# Patient Record
Sex: Male | Born: 1955
Health system: Southern US, Community
[De-identification: ages and names within clinical notes are randomized; demographics above are authoritative.]

## PROBLEM LIST (undated history)

## (undated) DIAGNOSIS — N200 Calculus of kidney: Secondary | ICD-10-CM

## (undated) DIAGNOSIS — I219 Acute myocardial infarction, unspecified: Secondary | ICD-10-CM

## (undated) DIAGNOSIS — C801 Malignant (primary) neoplasm, unspecified: Secondary | ICD-10-CM

## (undated) DIAGNOSIS — J189 Pneumonia, unspecified organism: Secondary | ICD-10-CM

## (undated) DIAGNOSIS — I10 Essential (primary) hypertension: Secondary | ICD-10-CM

## (undated) DIAGNOSIS — Z720 Tobacco use: Secondary | ICD-10-CM

## (undated) DIAGNOSIS — E785 Hyperlipidemia, unspecified: Secondary | ICD-10-CM

## (undated) DIAGNOSIS — Z87442 Personal history of urinary calculi: Secondary | ICD-10-CM

## (undated) DIAGNOSIS — L409 Psoriasis, unspecified: Secondary | ICD-10-CM

## (undated) HISTORY — DX: Essential (primary) hypertension: I10

## (undated) HISTORY — DX: Psoriasis, unspecified: L40.9

## (undated) HISTORY — DX: Pneumonia, unspecified organism: J18.9

## (undated) HISTORY — PX: COLONOSCOPY: SHX5424

## (undated) HISTORY — DX: Hyperlipidemia, unspecified: E78.5

## (undated) HISTORY — DX: Calculus of kidney: N20.0

---
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
## 2008-09-02 ENCOUNTER — Emergency Department (HOSPITAL_COMMUNITY): Admission: EM | Admit: 2008-09-02 | Discharge: 2008-09-02 | Payer: Self-pay | Admitting: Emergency Medicine

## 2008-09-11 ENCOUNTER — Ambulatory Visit (HOSPITAL_BASED_OUTPATIENT_CLINIC_OR_DEPARTMENT_OTHER): Admission: RE | Admit: 2008-09-11 | Discharge: 2008-09-11 | Payer: Self-pay | Admitting: Urology

## 2009-03-09 ENCOUNTER — Emergency Department (HOSPITAL_COMMUNITY): Admission: EM | Admit: 2009-03-09 | Discharge: 2009-03-09 | Payer: Self-pay | Admitting: Emergency Medicine

## 2010-04-02 LAB — COMPREHENSIVE METABOLIC PANEL
ALT: 26 U/L (ref 0–53)
AST: 19 U/L (ref 0–37)
Albumin: 4.3 g/dL (ref 3.5–5.2)
Alkaline Phosphatase: 37 U/L — ABNORMAL LOW (ref 39–117)
BUN: 18 mg/dL (ref 6–23)
CO2: 29 mEq/L (ref 19–32)
Calcium: 9.3 mg/dL (ref 8.4–10.5)
Chloride: 107 mEq/L (ref 96–112)
Creatinine, Ser: 0.83 mg/dL (ref 0.4–1.5)
GFR calc Af Amer: >60 mL/min (ref >60)
GFR calc non Af Amer: >60 mL/min (ref >60)
Glucose, Bld: 109 mg/dL — ABNORMAL HIGH (ref 70–99)
Potassium: 4.1 mEq/L (ref 3.5–5.1)
Sodium: 142 mEq/L (ref 135–145)
Total Bilirubin: 0.5 mg/dL (ref 0.3–1.2)
Total Protein: 7.1 g/dL (ref 6.0–8.3)

## 2010-04-02 LAB — CBC
HCT: 43.6 % (ref 39.0–52.0)
Hemoglobin: 14.4 g/dL (ref 13.0–17.0)
MCHC: 33.1 g/dL (ref 30.0–36.0)
MCV: 91 fL (ref 78.0–100.0)
Platelets: 321 10*3/uL (ref 150–400)
RBC: 4.79 MIL/uL (ref 4.22–5.81)
RDW: 13.3 % (ref 11.5–15.5)
WBC: 7.9 10*3/uL (ref 4.0–10.5)

## 2010-04-02 LAB — DIFFERENTIAL
Basophils Absolute: 0 10*3/uL (ref 0.0–0.1)
Basophils Relative: 0 % (ref 0–1)
Eosinophils Absolute: 0.4 10*3/uL (ref 0.0–0.7)
Eosinophils Relative: 5 % (ref 0–5)
Lymphocytes Relative: 26 % (ref 12–46)
Lymphs Abs: 2.1 10*3/uL (ref 0.7–4.0)
Monocytes Absolute: 0.6 10*3/uL (ref 0.1–1.0)
Monocytes Relative: 7 % (ref 3–12)
Neutro Abs: 4.8 10*3/uL (ref 1.7–7.7)
Neutrophils Relative %: 61 % (ref 43–77)

## 2010-04-02 LAB — URINALYSIS, ROUTINE W REFLEX MICROSCOPIC
Bilirubin Urine: NEGATIVE
Glucose, UA: NEGATIVE mg/dL
Hgb urine dipstick: NEGATIVE
Ketones, ur: NEGATIVE mg/dL
Nitrite: NEGATIVE
Protein, ur: NEGATIVE mg/dL
Specific Gravity, Urine: 1.011 (ref 1.005–1.030)
Urobilinogen, UA: 0.2 mg/dL (ref 0.0–1.0)
pH: 6.5 (ref 5.0–8.0)

## 2010-04-02 LAB — D-DIMER, QUANTITATIVE (NOT AT ARMC): D-Dimer, Quant: 0.34 ug/mL-FEU (ref 0.00–0.48)

## 2010-04-02 LAB — LIPASE, BLOOD: Lipase: 35 U/L (ref 11–59)

## 2010-04-19 LAB — URINE MICROSCOPIC-ADD ON

## 2010-04-19 LAB — URINALYSIS, ROUTINE W REFLEX MICROSCOPIC
Bilirubin Urine: NEGATIVE
Glucose, UA: NEGATIVE mg/dL
Ketones, ur: NEGATIVE mg/dL
Leukocytes, UA: NEGATIVE
Nitrite: NEGATIVE
Protein, ur: NEGATIVE mg/dL
Specific Gravity, Urine: 1.021 (ref 1.005–1.030)
Urobilinogen, UA: 0.2 mg/dL (ref 0.0–1.0)
pH: 6 (ref 5.0–8.0)

## 2010-04-19 LAB — CBC
HCT: 43.9 % (ref 39.0–52.0)
Hemoglobin: 14.8 g/dL (ref 13.0–17.0)
MCHC: 33.6 g/dL (ref 30.0–36.0)
MCV: 91.9 fL (ref 78.0–100.0)
Platelets: 250 10*3/uL (ref 150–400)
RBC: 4.78 MIL/uL (ref 4.22–5.81)
RDW: 14.4 % (ref 11.5–15.5)
WBC: 7.7 10*3/uL (ref 4.0–10.5)

## 2010-04-19 LAB — BASIC METABOLIC PANEL
BUN: 20 mg/dL (ref 6–23)
CO2: 28 mEq/L (ref 19–32)
Calcium: 9.5 mg/dL (ref 8.4–10.5)
Chloride: 109 mEq/L (ref 96–112)
Creatinine, Ser: 1.02 mg/dL (ref 0.4–1.5)
GFR calc Af Amer: >60 mL/min (ref >60)
GFR calc non Af Amer: >60 mL/min (ref >60)
Glucose, Bld: 110 mg/dL — ABNORMAL HIGH (ref 70–99)
Potassium: 4.5 mEq/L (ref 3.5–5.1)
Sodium: 143 mEq/L (ref 135–145)

## 2010-04-19 LAB — POCT I-STAT, CHEM 8
BUN: 18 mg/dL (ref 6–23)
Calcium, Ion: 1.15 mmol/L (ref 1.12–1.32)
Chloride: 104 mEq/L (ref 96–112)
Creatinine, Ser: 1.2 mg/dL (ref 0.4–1.5)
Glucose, Bld: 110 mg/dL — ABNORMAL HIGH (ref 70–99)
HCT: 44 % (ref 39.0–52.0)
Hemoglobin: 15 g/dL (ref 13.0–17.0)
Potassium: 4 mEq/L (ref 3.5–5.1)
Sodium: 140 mEq/L (ref 135–145)
TCO2: 26 mmol/L (ref 0–100)

## 2010-04-19 LAB — GLUCOSE, CAPILLARY: Glucose-Capillary: 117 mg/dL — ABNORMAL HIGH (ref 70–99)

## 2010-05-27 NOTE — Op Note (Signed)
[name], [name]                 ACCOUNT NO.:  [id number]   MEDICAL RECORD NO.:  [id number]          PATIENT TYPE:  AMB   LOCATION:  [facility]                         FACILITY:  [facility]   PHYSICIAN:  Courtney Paris, M.D.DATE OF BIRTH:  1955/05/07   DATE OF PROCEDURE:  09/11/2008  DATE OF DISCHARGE:                               OPERATIVE REPORT   PREOPERATIVE DIAGNOSIS:  Left distal ureteral stones.   POSTOPERATIVE DIAGNOSIS:  Left distal ureteral stones.   OPERATION:  Cystoscopy, left retrograde pyelogram, left ureteroscopy  with holmium laser and insertion of left ureteral stent.   ANESTHESIA:  General.   SURGEON:  Courtney Paris, M.D.   BRIEF HISTORY:  This 55 year old white male was admitted with non  progression of a left distal stone for ureteroscopy.  He presented to  the emergency room on September 03, 2008 with a 7 x 5 mm stone at the left  distal ureter.  He has had no other stones in the kidneys at this time.  He passed his first stone at age 55, and I did ureteroscopy in 1987 for  his last stone which it took over 3 weeks for him to try to pass before  he had ureteroscopy.   DESCRIPTION OF PROCEDURE:  The patient was placed on the operating table  in the dorsal lithotomy position.  After satisfactory induction of  general anesthesia he was given IV Cipro.  Time-out was then performed  and the patient and procedure were then reconfirmed.  He was prepped and  draped with Betadine.  The 21 panendoscope was inserted.  No anterior  urethral strictures seen.  Posterior urethra was nonobstructing.  The  bladder was entered.  The bladder was normal except for enlargement of  the left distal ureter probably caused by the impacted stone present.  I  used a 5 open-ended ureteral catheter with a Glidewire to be able to  negotiate into the orifice where the stone was impacted.  Under  fluoroscopy I was able to negotiate this past the stone.  Removed the  Glidewire then  performed a occlusive retrograde.  This demonstrated the  stone in the distal ureter and some moderate hydronephrosis noted of the  left kidney.  No other filling defects were seen.  Under fluoroscopy I  was able to pass a guidewire then up to the level of the kidney and  remove the open-ended catheter.  I removed the scope and then using a  cannula for a ureteral access sheath I was able to under fluoroscopy  dilate the distal left ureter leaving the guidewire in place.  I passed  the 6 short ureteroscope into the left ureteral orifice up to the stone,  but it looked to be too big to try to pull out without breaking it up.  The holmium laser was then calibrated and using the 320 micron fiber at  0.5 watts I was able to break the stone into two pieces.  I was then  able to get a basket on the stones and pull them out intact.  Another  passage of the  scope revealed no further stones, but there was a lot of  edema and swelling of the distal ureter where the stone was impacted.  For that reason I chose to leave a ureteral stent.  Over the guidewire I  passed the cystoscope and then a 6 x 26 cm length double-J ureteral  stent over the guidewire into the kidney and as I removed the guidewire  there was a nice coil in the renal pelvis and one in the bladder.  I  adjusted  slightly with grasping forceps.  The bladder was drained, scope removed.  He was given a B and O suppository and injection of Toradol.  He was  taken to the recovery room in good condition and will be later  discharged as an outpatient with detailed written instructions and will  remove the stent in a week.      Courtney Paris, M.D.  Electronically Signed     HMK/MEDQ  D:  09/11/2008  T:  09/11/2008  Job:  [id number]

## 2011-06-10 ENCOUNTER — Ambulatory Visit
Admission: RE | Admit: 2011-06-10 | Discharge: 2011-06-10 | Disposition: A | Discharge status: Home / Self Care | Payer: BC Managed Care – PPO | Source: Ambulatory Visit | Attending: Family Medicine | Admitting: Family Medicine

## 2011-06-10 ENCOUNTER — Other Ambulatory Visit: Payer: Self-pay | Admitting: Family Medicine

## 2011-06-10 DIAGNOSIS — R079 Chest pain, unspecified: Secondary | ICD-10-CM

## 2011-06-25 ENCOUNTER — Encounter (INDEPENDENT_AMBULATORY_CARE_PROVIDER_SITE_OTHER): Payer: Self-pay | Admitting: Surgery

## 2011-08-25 ENCOUNTER — Ambulatory Visit (INDEPENDENT_AMBULATORY_CARE_PROVIDER_SITE_OTHER): Payer: BC Managed Care – PPO | Admitting: Surgery

## 2015-04-30 ENCOUNTER — Other Ambulatory Visit: Payer: Self-pay | Admitting: Family Medicine

## 2015-04-30 DIAGNOSIS — M5416 Radiculopathy, lumbar region: Secondary | ICD-10-CM

## 2015-05-01 ENCOUNTER — Ambulatory Visit
Admission: RE | Admit: 2015-05-01 | Discharge: 2015-05-01 | Disposition: A | Discharge status: Home / Self Care | Payer: 59 | Source: Ambulatory Visit | Attending: Family Medicine | Admitting: Family Medicine

## 2015-05-01 ENCOUNTER — Other Ambulatory Visit: Payer: Self-pay | Admitting: Family Medicine

## 2015-05-01 DIAGNOSIS — T7589XA Other specified effects of external causes, initial encounter: Secondary | ICD-10-CM

## 2015-05-01 DIAGNOSIS — M5416 Radiculopathy, lumbar region: Secondary | ICD-10-CM

## 2015-05-02 ENCOUNTER — Other Ambulatory Visit: Payer: Self-pay

## 2015-05-04 ENCOUNTER — Other Ambulatory Visit: Payer: Self-pay

## 2016-09-15 DIAGNOSIS — E782 Mixed hyperlipidemia: Secondary | ICD-10-CM | POA: Diagnosis not present

## 2016-09-15 DIAGNOSIS — I10 Essential (primary) hypertension: Secondary | ICD-10-CM | POA: Diagnosis not present

## 2016-09-15 DIAGNOSIS — Z23 Encounter for immunization: Secondary | ICD-10-CM | POA: Diagnosis not present

## 2016-09-15 DIAGNOSIS — E559 Vitamin D deficiency, unspecified: Secondary | ICD-10-CM | POA: Diagnosis not present

## 2016-09-29 DIAGNOSIS — D1801 Hemangioma of skin and subcutaneous tissue: Secondary | ICD-10-CM | POA: Diagnosis not present

## 2016-09-29 DIAGNOSIS — L821 Other seborrheic keratosis: Secondary | ICD-10-CM | POA: Diagnosis not present

## 2016-09-29 DIAGNOSIS — L814 Other melanin hyperpigmentation: Secondary | ICD-10-CM | POA: Diagnosis not present

## 2016-11-24 DIAGNOSIS — H353131 Nonexudative age-related macular degeneration, bilateral, early dry stage: Secondary | ICD-10-CM | POA: Diagnosis not present

## 2016-11-24 DIAGNOSIS — H2513 Age-related nuclear cataract, bilateral: Secondary | ICD-10-CM | POA: Diagnosis not present

## 2016-11-24 DIAGNOSIS — H25013 Cortical age-related cataract, bilateral: Secondary | ICD-10-CM | POA: Diagnosis not present

## 2017-02-24 DIAGNOSIS — J029 Acute pharyngitis, unspecified: Secondary | ICD-10-CM | POA: Diagnosis not present

## 2017-02-24 DIAGNOSIS — J0110 Acute frontal sinusitis, unspecified: Secondary | ICD-10-CM | POA: Diagnosis not present

## 2017-04-13 DIAGNOSIS — I10 Essential (primary) hypertension: Secondary | ICD-10-CM | POA: Diagnosis not present

## 2017-04-13 DIAGNOSIS — E782 Mixed hyperlipidemia: Secondary | ICD-10-CM | POA: Diagnosis not present

## 2017-09-27 DIAGNOSIS — N529 Male erectile dysfunction, unspecified: Secondary | ICD-10-CM | POA: Diagnosis not present

## 2017-09-27 DIAGNOSIS — E559 Vitamin D deficiency, unspecified: Secondary | ICD-10-CM | POA: Diagnosis not present

## 2017-09-27 DIAGNOSIS — J0101 Acute recurrent maxillary sinusitis: Secondary | ICD-10-CM | POA: Diagnosis not present

## 2017-09-27 DIAGNOSIS — E782 Mixed hyperlipidemia: Secondary | ICD-10-CM | POA: Diagnosis not present

## 2017-09-27 DIAGNOSIS — Z0000 Encounter for general adult medical examination without abnormal findings: Secondary | ICD-10-CM | POA: Diagnosis not present

## 2017-09-27 DIAGNOSIS — G4700 Insomnia, unspecified: Secondary | ICD-10-CM | POA: Diagnosis not present

## 2017-09-29 DIAGNOSIS — D1801 Hemangioma of skin and subcutaneous tissue: Secondary | ICD-10-CM | POA: Diagnosis not present

## 2017-09-29 DIAGNOSIS — L814 Other melanin hyperpigmentation: Secondary | ICD-10-CM | POA: Diagnosis not present

## 2017-09-29 DIAGNOSIS — L821 Other seborrheic keratosis: Secondary | ICD-10-CM | POA: Diagnosis not present

## 2017-10-11 DIAGNOSIS — Z23 Encounter for immunization: Secondary | ICD-10-CM | POA: Diagnosis not present

## 2017-12-03 DIAGNOSIS — H524 Presbyopia: Secondary | ICD-10-CM | POA: Diagnosis not present

## 2017-12-03 DIAGNOSIS — D3131 Benign neoplasm of right choroid: Secondary | ICD-10-CM | POA: Diagnosis not present

## 2017-12-03 DIAGNOSIS — H35033 Hypertensive retinopathy, bilateral: Secondary | ICD-10-CM | POA: Diagnosis not present

## 2017-12-03 DIAGNOSIS — H353131 Nonexudative age-related macular degeneration, bilateral, early dry stage: Secondary | ICD-10-CM | POA: Diagnosis not present

## 2018-03-28 DIAGNOSIS — E782 Mixed hyperlipidemia: Secondary | ICD-10-CM | POA: Diagnosis not present

## 2018-03-28 DIAGNOSIS — I10 Essential (primary) hypertension: Secondary | ICD-10-CM | POA: Diagnosis not present

## 2018-03-28 DIAGNOSIS — E559 Vitamin D deficiency, unspecified: Secondary | ICD-10-CM | POA: Diagnosis not present

## 2018-03-28 DIAGNOSIS — E538 Deficiency of other specified B group vitamins: Secondary | ICD-10-CM | POA: Diagnosis not present

## 2018-03-28 DIAGNOSIS — G4700 Insomnia, unspecified: Secondary | ICD-10-CM | POA: Diagnosis not present

## 2018-03-28 DIAGNOSIS — N529 Male erectile dysfunction, unspecified: Secondary | ICD-10-CM | POA: Diagnosis not present

## 2018-11-28 ENCOUNTER — Other Ambulatory Visit: Payer: Self-pay | Admitting: *Deleted

## 2018-11-28 DIAGNOSIS — Z122 Encounter for screening for malignant neoplasm of respiratory organs: Secondary | ICD-10-CM

## 2018-11-28 DIAGNOSIS — F17210 Nicotine dependence, cigarettes, uncomplicated: Secondary | ICD-10-CM

## 2018-12-14 ENCOUNTER — Ambulatory Visit
Admission: RE | Admit: 2018-12-14 | Discharge: 2018-12-14 | Disposition: A | Discharge status: Home / Self Care | Payer: 59 | Source: Ambulatory Visit | Attending: Acute Care | Admitting: Acute Care

## 2018-12-14 ENCOUNTER — Encounter: Payer: Self-pay | Admitting: Acute Care

## 2018-12-14 ENCOUNTER — Other Ambulatory Visit: Payer: Self-pay

## 2018-12-14 ENCOUNTER — Ambulatory Visit (INDEPENDENT_AMBULATORY_CARE_PROVIDER_SITE_OTHER): Payer: 59 | Admitting: Acute Care

## 2018-12-14 VITALS — BP 134/60 | HR 59 | Temp 98.2°F | Ht 71.0 in | Wt 182.0 lb

## 2018-12-14 DIAGNOSIS — Z122 Encounter for screening for malignant neoplasm of respiratory organs: Secondary | ICD-10-CM

## 2018-12-14 DIAGNOSIS — F17210 Nicotine dependence, cigarettes, uncomplicated: Secondary | ICD-10-CM | POA: Diagnosis not present

## 2018-12-14 NOTE — Patient Instructions (Signed)
Thank you for participating in the [place] Scre[facility]. It was our pleasure to meet you today. We will call you with the results of your scan within the next few days. Your scan will be assigned a Lung RADS category score by the physicians reading the scans.  This Lung RADS score determines follow up scanning.  See below for description of categories, and follow up screening recommendations. We will be in touch to schedule your follow up screening annually or based on recommendations of our providers. We will fax a copy of your scan results to your Primary Care Physician, or the physician who referred you to the program, to ensure they have the results. Please call the office if you have any questions or concerns regarding your scanning experience or results.  Our office number is [phone]. Please speak with Denise Phelps, RN. She is our Lung Cancer Screening RN. If she is unavailable when you call, please have the office staff send her a message. She will return your call at her earliest convenience. Remember, if your scan is normal, we will scan you annually as long as you continue to meet the criteria for the program. (Age 55-77, Current smoker or smoker who has quit within the last 15 years). If you are a smoker, remember, quitting is the single most powerful action that you can take to decrease your risk of lung cancer and other pulmonary, breathing related problems. We know quitting is hard, and we are here to help.  Please let us know if there is anything we can do to help you meet your goal of quitting. If you are a former smoker, congratulations. We are proud of you! Remain smoke free! Remember you can refer friends or family members through the number above.  We will screen them to make sure they meet criteria for the program. Thank you for helping us take better care of you by participating in Lung Screening.  Lung RADS Categories:  Lung RADS 1: no nodules  or definitely non-concerning nodules.  Recommendation is for a repeat annual scan in 12 months.  Lung RADS 2:  nodules that are non-concerning in appearance and behavior with a very low likelihood of becoming an active cancer. Recommendation is for a repeat annual scan in 12 months.  Lung RADS 3: nodules that are probably non-concerning , includes nodules with a low likelihood of becoming an active cancer.  Recommendation is for a 6-month repeat screening scan. Often noted after an upper respiratory illness. We will be in touch to make sure you have no questions, and to schedule your 6-month scan.  Lung RADS 4 A: nodules with concerning findings, recommendation is most often for a follow up scan in 3 months or additional testing based on our provider's assessment of the scan. We will be in touch to make sure you have no questions and to schedule the recommended 3 month follow up scan.  Lung RADS 4 B:  indicates findings that are concerning. We will be in touch with you to schedule additional diagnostic testing based on our provider's  assessment of the scan.   

## 2018-12-14 NOTE — Progress Notes (Signed)
Shared Decision Making Visit Lung Cancer Screening Program [phone])   Eligibility:  Age 63 y.o.  Pack Years Smoking History Calculation 43 pack years (# packs/per year x # years smoked)  Recent History of coughing up blood  no  Unexplained weight loss? no ( >Than 15 pounds within the last 6 months )  Prior History Lung / other cancer no (Diagnosis within the last 5 years already requiring surveillance chest CT Scans).  Smoking Status Current Smoker  Former Smokers: Years since quit: NA  Quit Date: NA  Visit Components:  Discussion included one or more decision making aids. yes  Discussion included risk/benefits of screening. yes  Discussion included potential follow up diagnostic testing for abnormal scans. yes  Discussion included meaning and risk of over diagnosis. yes  Discussion included meaning and risk of False Positives. yes  Discussion included meaning of total radiation exposure. yes  Counseling Included:  Importance of adherence to annual lung cancer LDCT screening. yes  Impact of comorbidities on ability to participate in the program. yes  Ability and willingness to under diagnostic treatment. yes  Smoking Cessation Counseling:  Current Smokers:   Discussed importance of smoking cessation. yes  Information about tobacco cessation classes and interventions provided to patient. yes  Patient provided with "ticket" for LDCT Scan. yes  Symptomatic Patient. no  Counseling  Diagnosis Code: Tobacco Use Z72.0  Asymptomatic Patient yes  Counseling (Intermediate counseling: > three minutes counseling) [id number]  Former Smokers:   Discussed the importance of maintaining cigarette abstinence. yes  Diagnosis Code: Personal History of Nicotine Dependence. [id number]  Information about tobacco cessation classes and interventions provided to patient. Yes  Patient provided with "ticket" for LDCT Scan. yes  Written Order for Lung Cancer Screening with LDCT  placed in Epic. Yes (CT Chest Lung Cancer Screening Low Dose W/O CM) [id number] Z12.2-Screening of respiratory organs Z87.891-Personal history of nicotine dependence  I  BP 134/60 (BP Location: Left Arm, Cuff Size: Normal)   Pulse (!) 59   Temp 98.2 F (36.8 C) (Oral)   Ht 5\' 11"  (1.803 m)   Wt 182 lb (82.6 kg)   SpO2 98%   BMI 25.38 kg/m   I have spent 25 minutes of face to face time with Mr. Fanella discussing the risks and benefits of lung cancer screening. We viewed a power point together that explained in detail the above noted topics. We paused at intervals to allow for questions to be asked and answered to ensure understanding.We discussed that the single most powerful action that he can take to decrease his risk of developing lung cancer is to quit smoking. We discussed whether or not he is ready to commit to setting a quit date. We discussed options for tools to aid in quitting smoking including nicotine replacement therapy, non-nicotine medications, support groups, Quit Smart classes, and behavior modification. We discussed that often times setting smaller, more achievable goals, such as eliminating 1 cigarette a day for a week and then 2 cigarettes a day for a week can be helpful in slowly decreasing the number of cigarettes smoked. This allows for a sense of accomplishment as well as providing a clinical benefit. I gave him the " Be Stronger Than Your Excuses" card with contact information for community resources, classes, free nicotine replacement therapy, and access to mobile apps, text messaging, and on-line smoking cessation help. I have also given him my card and contact information in the event he needs to contact me. We discussed the time and  location of the scan, and that either Doroteo Glassman RN or I will call with the results within 24-48 hours of receiving them. I have offered him  a copy of the power point we viewed  as a resource in the event they need reinforcement of the  concepts we discussed today in the office. The patient verbalized understanding of all of  the above and had no further questions upon leaving the office. They have my contact information in the event they have any further questions.  I spent 4 minutes counseling on smoking cessation and the health risks of continued tobacco abuse.  I explained to the patient that there has been a high incidence of coronary artery disease noted on these exams. I explained that this is a non-gated exam therefore degree or severity cannot be determined. This patient is not on statin therapy. I have asked the patient to follow-up with their PCP regarding any incidental finding of coronary artery disease and management with diet or medication as their PCP  feels is clinically indicated. The patient verbalized understanding of the above and had no further questions upon completion of the visit.      Magdalen Spatz, NP 12/14/2018 11:56 AM

## 2018-12-21 ENCOUNTER — Other Ambulatory Visit: Payer: Self-pay | Admitting: *Deleted

## 2018-12-21 DIAGNOSIS — F17210 Nicotine dependence, cigarettes, uncomplicated: Secondary | ICD-10-CM

## 2018-12-21 DIAGNOSIS — Z122 Encounter for screening for malignant neoplasm of respiratory organs: Secondary | ICD-10-CM

## 2018-12-21 DIAGNOSIS — Z87891 Personal history of nicotine dependence: Secondary | ICD-10-CM

## 2018-12-26 ENCOUNTER — Other Ambulatory Visit: Payer: Self-pay | Admitting: Family Medicine

## 2018-12-26 DIAGNOSIS — N289 Disorder of kidney and ureter, unspecified: Secondary | ICD-10-CM

## 2019-01-03 ENCOUNTER — Ambulatory Visit
Admission: RE | Admit: 2019-01-03 | Discharge: 2019-01-03 | Disposition: A | Discharge status: Home / Self Care | Payer: 59 | Source: Ambulatory Visit | Attending: Family Medicine | Admitting: Family Medicine

## 2019-01-03 ENCOUNTER — Ambulatory Visit: Payer: 59

## 2019-01-03 DIAGNOSIS — N289 Disorder of kidney and ureter, unspecified: Secondary | ICD-10-CM

## 2019-01-16 ENCOUNTER — Other Ambulatory Visit: Payer: Self-pay | Admitting: Family Medicine

## 2019-01-16 DIAGNOSIS — N289 Disorder of kidney and ureter, unspecified: Secondary | ICD-10-CM

## 2019-02-10 ENCOUNTER — Inpatient Hospital Stay (HOSPITAL_COMMUNITY)
Admission: EM | Admit: 2019-02-10 | Discharge: 2019-02-11 | DRG: 247 | Disposition: A | Discharge status: Home / Self Care | Payer: 59 | Attending: Cardiology | Admitting: Cardiology

## 2019-02-10 ENCOUNTER — Other Ambulatory Visit: Payer: Self-pay

## 2019-02-10 ENCOUNTER — Emergency Department (HOSPITAL_COMMUNITY): Payer: 59

## 2019-02-10 ENCOUNTER — Encounter (HOSPITAL_COMMUNITY): Payer: Self-pay | Admitting: Cardiology

## 2019-02-10 ENCOUNTER — Encounter (HOSPITAL_COMMUNITY)
Admission: EM | Disposition: A | Discharge status: Home / Self Care | Payer: Self-pay | Source: Home / Self Care | Attending: Cardiology

## 2019-02-10 DIAGNOSIS — F17210 Nicotine dependence, cigarettes, uncomplicated: Secondary | ICD-10-CM | POA: Diagnosis present

## 2019-02-10 DIAGNOSIS — Z79899 Other long term (current) drug therapy: Secondary | ICD-10-CM | POA: Diagnosis not present

## 2019-02-10 DIAGNOSIS — R079 Chest pain, unspecified: Secondary | ICD-10-CM

## 2019-02-10 DIAGNOSIS — Z888 Allergy status to other drugs, medicaments and biological substances status: Secondary | ICD-10-CM | POA: Diagnosis not present

## 2019-02-10 DIAGNOSIS — Z20822 Contact with and (suspected) exposure to covid-19: Secondary | ICD-10-CM | POA: Diagnosis present

## 2019-02-10 DIAGNOSIS — I2102 ST elevation (STEMI) myocardial infarction involving left anterior descending coronary artery: Secondary | ICD-10-CM | POA: Diagnosis present

## 2019-02-10 DIAGNOSIS — E785 Hyperlipidemia, unspecified: Secondary | ICD-10-CM | POA: Diagnosis present

## 2019-02-10 DIAGNOSIS — I10 Essential (primary) hypertension: Secondary | ICD-10-CM | POA: Diagnosis present

## 2019-02-10 DIAGNOSIS — Z955 Presence of coronary angioplasty implant and graft: Secondary | ICD-10-CM

## 2019-02-10 DIAGNOSIS — I700 Atherosclerosis of aorta: Secondary | ICD-10-CM | POA: Diagnosis present

## 2019-02-10 DIAGNOSIS — I2510 Atherosclerotic heart disease of native coronary artery without angina pectoris: Secondary | ICD-10-CM | POA: Diagnosis present

## 2019-02-10 DIAGNOSIS — L409 Psoriasis, unspecified: Secondary | ICD-10-CM | POA: Diagnosis present

## 2019-02-10 DIAGNOSIS — Z7982 Long term (current) use of aspirin: Secondary | ICD-10-CM | POA: Diagnosis not present

## 2019-02-10 DIAGNOSIS — Z7289 Other problems related to lifestyle: Secondary | ICD-10-CM

## 2019-02-10 DIAGNOSIS — Z881 Allergy status to other antibiotic agents status: Secondary | ICD-10-CM

## 2019-02-10 DIAGNOSIS — I213 ST elevation (STEMI) myocardial infarction of unspecified site: Secondary | ICD-10-CM

## 2019-02-10 HISTORY — PX: CORONARY THROMBECTOMY: CATH118304

## 2019-02-10 HISTORY — PX: CORONARY ULTRASOUND/IVUS: CATH118244

## 2019-02-10 HISTORY — PX: CORONARY/GRAFT ACUTE MI REVASCULARIZATION: CATH118305

## 2019-02-10 HISTORY — PX: LEFT HEART CATH AND CORONARY ANGIOGRAPHY: CATH118249

## 2019-02-10 HISTORY — DX: Tobacco use: Z72.0

## 2019-02-10 LAB — PROTIME-INR
INR: 0.9 (ref 0.8–1.2)
Prothrombin Time: 12.3 seconds (ref 11.4–15.2)

## 2019-02-10 LAB — POCT I-STAT, CHEM 8
BUN: 16 mg/dL (ref 8–23)
Calcium, Ion: 1.18 mmol/L (ref 1.15–1.40)
Chloride: 110 mmol/L (ref 98–111)
Creatinine, Ser: 0.8 mg/dL (ref 0.61–1.24)
Glucose, Bld: 139 mg/dL — ABNORMAL HIGH (ref 70–99)
HCT: 40 % (ref 39.0–52.0)
Hemoglobin: 13.6 g/dL (ref 13.0–17.0)
Potassium: 3.6 mmol/L (ref 3.5–5.1)
Sodium: 141 mmol/L (ref 135–145)
TCO2: 22 mmol/L (ref 22–32)

## 2019-02-10 LAB — COMPREHENSIVE METABOLIC PANEL
ALT: 24 U/L (ref 0–44)
AST: 21 U/L (ref 15–41)
Albumin: 4 g/dL (ref 3.5–5.0)
Alkaline Phosphatase: 42 U/L (ref 38–126)
Anion gap: 9 (ref 5–15)
BUN: 15 mg/dL (ref 8–23)
CO2: 24 mmol/L (ref 22–32)
Calcium: 9.3 mg/dL (ref 8.9–10.3)
Chloride: 109 mmol/L (ref 98–111)
Creatinine, Ser: 1.02 mg/dL (ref 0.61–1.24)
GFR calc Af Amer: >60 mL/min (ref >60)
GFR calc non Af Amer: >60 mL/min (ref >60)
Glucose, Bld: 111 mg/dL — ABNORMAL HIGH (ref 70–99)
Potassium: 4.1 mmol/L (ref 3.5–5.1)
Sodium: 142 mmol/L (ref 135–145)
Total Bilirubin: 0.4 mg/dL (ref 0.3–1.2)
Total Protein: 6.7 g/dL (ref 6.5–8.1)

## 2019-02-10 LAB — CBC WITH DIFFERENTIAL/PLATELET
Abs Immature Granulocytes: 0.14 10*3/uL — ABNORMAL HIGH (ref 0.00–0.07)
Basophils Absolute: 0.1 10*3/uL (ref 0.0–0.1)
Basophils Relative: 1 %
Eosinophils Absolute: 0.3 10*3/uL (ref 0.0–0.5)
Eosinophils Relative: 3 %
HCT: 45.1 % (ref 39.0–52.0)
Hemoglobin: 14.6 g/dL (ref 13.0–17.0)
Immature Granulocytes: 1 %
Lymphocytes Relative: 21 %
Lymphs Abs: 2.1 10*3/uL (ref 0.7–4.0)
MCH: 30.5 pg (ref 26.0–34.0)
MCHC: 32.4 g/dL (ref 30.0–36.0)
MCV: 94.2 fL (ref 80.0–100.0)
Monocytes Absolute: 0.8 10*3/uL (ref 0.1–1.0)
Monocytes Relative: 8 %
Neutro Abs: 6.7 10*3/uL (ref 1.7–7.7)
Neutrophils Relative %: 66 %
Platelets: 264 10*3/uL (ref 150–400)
RBC: 4.79 MIL/uL (ref 4.22–5.81)
RDW: 12.3 % (ref 11.5–15.5)
WBC: 10.1 10*3/uL (ref 4.0–10.5)
nRBC: 0 % (ref 0.0–0.2)

## 2019-02-10 LAB — CBC
HCT: 40.4 % (ref 39.0–52.0)
Hemoglobin: 13.6 g/dL (ref 13.0–17.0)
MCH: 30.8 pg (ref 26.0–34.0)
MCHC: 33.7 g/dL (ref 30.0–36.0)
MCV: 91.4 fL (ref 80.0–100.0)
Platelets: 263 10*3/uL (ref 150–400)
RBC: 4.42 MIL/uL (ref 4.22–5.81)
RDW: 12.5 % (ref 11.5–15.5)
WBC: 13 10*3/uL — ABNORMAL HIGH (ref 4.0–10.5)
nRBC: 0 % (ref 0.0–0.2)

## 2019-02-10 LAB — CREATININE, SERUM
Creatinine, Ser: 0.99 mg/dL (ref 0.61–1.24)
GFR calc Af Amer: >60 mL/min (ref >60)
GFR calc non Af Amer: >60 mL/min (ref >60)

## 2019-02-10 LAB — TROPONIN I (HIGH SENSITIVITY)
Troponin I (High Sensitivity): 1347 ng/L (ref <18)
Troponin I (High Sensitivity): 9083 ng/L (ref <18)

## 2019-02-10 LAB — LIPID PANEL
Cholesterol: 143 mg/dL (ref 0–200)
HDL: 34 mg/dL — ABNORMAL LOW (ref >40)
LDL Cholesterol: 94 mg/dL (ref 0–99)
Total CHOL/HDL Ratio: 4.2 RATIO
Triglycerides: 75 mg/dL (ref <150)
VLDL: 15 mg/dL (ref 0–40)

## 2019-02-10 LAB — MRSA PCR SCREENING: MRSA by PCR: NEGATIVE

## 2019-02-10 LAB — HEMOGLOBIN A1C
Hgb A1c MFr Bld: 5.7 % — ABNORMAL HIGH (ref 4.8–5.6)
Mean Plasma Glucose: 116.89 mg/dL

## 2019-02-10 LAB — SEDIMENTATION RATE: Sed Rate: 3 mm/hr (ref 0–16)

## 2019-02-10 LAB — POCT ACTIVATED CLOTTING TIME: Activated Clotting Time: 362 seconds

## 2019-02-10 LAB — RESPIRATORY PANEL BY RT PCR (FLU A&B, COVID)
Influenza A by PCR: NEGATIVE
Influenza B by PCR: NEGATIVE
SARS Coronavirus 2 by RT PCR: NEGATIVE

## 2019-02-10 LAB — HIV ANTIBODY (ROUTINE TESTING W REFLEX): HIV Screen 4th Generation wRfx: NONREACTIVE

## 2019-02-10 SURGERY — LEFT HEART CATH AND CORONARY ANGIOGRAPHY
Anesthesia: LOCAL

## 2019-02-10 MED ORDER — ZOLPIDEM TARTRATE 10 MG PO TABS: 10.0000 mg | ORAL_TABLET | Freq: Every evening | ORAL | Status: DC | PRN

## 2019-02-10 MED ORDER — NITROGLYCERIN 1 MG/10 ML FOR IR/CATH LAB
INTRA_ARTERIAL | Status: DC | PRN
  Administered 2019-02-10 (×2): 200 ug via INTRACORONARY

## 2019-02-10 MED ORDER — ONDANSETRON HCL 4 MG/2ML IJ SOLN: 4.0000 mg | Freq: Four times a day (QID) | INTRAMUSCULAR | Status: DC | PRN

## 2019-02-10 MED ORDER — NITROGLYCERIN 1 MG/10 ML FOR IR/CATH LAB
INTRA_ARTERIAL | Status: AC
  Filled 2019-02-10: qty 10

## 2019-02-10 MED ORDER — MIDAZOLAM HCL 2 MG/2ML IJ SOLN
INTRAMUSCULAR | Status: AC
  Filled 2019-02-10: qty 2

## 2019-02-10 MED ORDER — HEPARIN SODIUM (PORCINE) 5000 UNIT/ML IJ SOLN
5000.0000 [IU] | Freq: Three times a day (TID) | INTRAMUSCULAR | Status: DC
  Administered 2019-02-11: 5000 [IU] via SUBCUTANEOUS
  Filled 2019-02-10: qty 1

## 2019-02-10 MED ORDER — MIDAZOLAM HCL 2 MG/2ML IJ SOLN
INTRAMUSCULAR | Status: DC | PRN
  Administered 2019-02-10 (×2): 1 mg via INTRAVENOUS

## 2019-02-10 MED ORDER — ACETAMINOPHEN 325 MG PO TABS: 650.0000 mg | ORAL_TABLET | ORAL | Status: DC | PRN

## 2019-02-10 MED ORDER — TICAGRELOR 90 MG PO TABS: 90.0000 mg | ORAL_TABLET | Freq: Two times a day (BID) | ORAL | 1 refills | Status: DC

## 2019-02-10 MED ORDER — HEPARIN SODIUM (PORCINE) 5000 UNIT/ML IJ SOLN: 5000.0000 [IU] | Freq: Three times a day (TID) | INTRAMUSCULAR | Status: DC

## 2019-02-10 MED ORDER — TICAGRELOR 90 MG PO TABS
90.0000 mg | ORAL_TABLET | Freq: Two times a day (BID) | ORAL | Status: DC
  Administered 2019-02-11 (×2): 90 mg via ORAL
  Filled 2019-02-10 (×2): qty 1

## 2019-02-10 MED ORDER — METOPROLOL SUCCINATE ER 25 MG PO TB24
25.0000 mg | ORAL_TABLET | Freq: Every day | ORAL | Status: DC
  Administered 2019-02-11: 25 mg via ORAL
  Filled 2019-02-10: qty 1

## 2019-02-10 MED ORDER — TIROFIBAN HCL IN NACL 5-0.9 MG/100ML-% IV SOLN
INTRAVENOUS | Status: AC
  Filled 2019-02-10: qty 100

## 2019-02-10 MED ORDER — LIDOCAINE HCL (PF) 1 % IJ SOLN
INTRAMUSCULAR | Status: AC
  Filled 2019-02-10: qty 30

## 2019-02-10 MED ORDER — VITAMIN B-12 1000 MCG PO TABS
1000.0000 ug | ORAL_TABLET | Freq: Every day | ORAL | Status: DC
  Administered 2019-02-10 – 2019-02-11 (×2): 1000 ug via ORAL
  Filled 2019-02-10 (×2): qty 1

## 2019-02-10 MED ORDER — HYDRALAZINE HCL 20 MG/ML IJ SOLN: 10.0000 mg | INTRAMUSCULAR | Status: AC | PRN

## 2019-02-10 MED ORDER — LIDOCAINE HCL (PF) 1 % IJ SOLN
INTRAMUSCULAR | Status: DC | PRN
  Administered 2019-02-10: 2 mL via SUBCUTANEOUS

## 2019-02-10 MED ORDER — TICAGRELOR 90 MG PO TABS
ORAL_TABLET | ORAL | Status: DC | PRN
  Administered 2019-02-10: 180 mg via ORAL

## 2019-02-10 MED ORDER — PRAVASTATIN SODIUM 40 MG PO TABS
40.0000 mg | ORAL_TABLET | Freq: Every day | ORAL | Status: DC
  Administered 2019-02-11: 40 mg via ORAL
  Filled 2019-02-10: qty 1

## 2019-02-10 MED ORDER — ZOLPIDEM TARTRATE 5 MG PO TABS: 10.0000 mg | ORAL_TABLET | Freq: Every evening | ORAL | Status: DC | PRN

## 2019-02-10 MED ORDER — SODIUM CHLORIDE 0.9 % IV SOLN: INTRAVENOUS | Status: AC

## 2019-02-10 MED ORDER — VERAPAMIL HCL 2.5 MG/ML IV SOLN
INTRAVENOUS | Status: AC
  Filled 2019-02-10: qty 2

## 2019-02-10 MED ORDER — FENTANYL CITRATE (PF) 100 MCG/2ML IJ SOLN
INTRAMUSCULAR | Status: DC | PRN
  Administered 2019-02-10 (×2): 25 ug via INTRAVENOUS

## 2019-02-10 MED ORDER — SODIUM CHLORIDE 0.9% FLUSH: 3.0000 mL | INTRAVENOUS | Status: DC | PRN

## 2019-02-10 MED ORDER — TICAGRELOR 90 MG PO TABS
ORAL_TABLET | ORAL | Status: AC
  Filled 2019-02-10: qty 2

## 2019-02-10 MED ORDER — VERAPAMIL HCL 2.5 MG/ML IV SOLN
INTRAVENOUS | Status: DC | PRN
  Administered 2019-02-10: 10 mL via INTRA_ARTERIAL

## 2019-02-10 MED ORDER — HEPARIN (PORCINE) IN NACL 1000-0.9 UT/500ML-% IV SOLN
INTRAVENOUS | Status: DC | PRN
  Administered 2019-02-10 (×2): 500 mL

## 2019-02-10 MED ORDER — ASPIRIN 81 MG PO CHEW
81.0000 mg | CHEWABLE_TABLET | Freq: Every day | ORAL | Status: DC
  Administered 2019-02-11: 81 mg via ORAL
  Filled 2019-02-10: qty 1

## 2019-02-10 MED ORDER — HEPARIN SODIUM (PORCINE) 1000 UNIT/ML IJ SOLN
INTRAMUSCULAR | Status: AC
  Filled 2019-02-10: qty 1

## 2019-02-10 MED ORDER — VERAPAMIL HCL 2.5 MG/ML IV SOLN
INTRAVENOUS | Status: DC | PRN
  Administered 2019-02-10: 200 ug via INTRACORONARY

## 2019-02-10 MED ORDER — HEPARIN BOLUS VIA INFUSION
4000.0000 [IU] | Freq: Once | INTRAVENOUS | Status: DC
  Filled 2019-02-10: qty 4000

## 2019-02-10 MED ORDER — IOHEXOL 350 MG/ML SOLN
INTRAVENOUS | Status: AC
  Filled 2019-02-10: qty 1

## 2019-02-10 MED ORDER — METOPROLOL SUCCINATE ER 25 MG PO TB24: 25.0000 mg | ORAL_TABLET | Freq: Every day | ORAL | 1 refills | Status: DC

## 2019-02-10 MED ORDER — TIROFIBAN (AGGRASTAT) BOLUS VIA INFUSION
INTRAVENOUS | Status: DC | PRN
  Administered 2019-02-10: 15:00:00 2075 ug via INTRAVENOUS

## 2019-02-10 MED ORDER — TIROFIBAN HCL IN NACL 5-0.9 MG/100ML-% IV SOLN
INTRAVENOUS | Status: DC | PRN
  Administered 2019-02-10: 0.075 ug/kg/min via INTRAVENOUS

## 2019-02-10 MED ORDER — IOHEXOL 350 MG/ML SOLN
INTRAVENOUS | Status: DC | PRN
  Administered 2019-02-10: 16:00:00 160 mL via INTRA_ARTERIAL

## 2019-02-10 MED ORDER — LABETALOL HCL 5 MG/ML IV SOLN
10.0000 mg | INTRAVENOUS | Status: AC | PRN
  Administered 2019-02-10: 10 mg via INTRAVENOUS
  Filled 2019-02-10: qty 4

## 2019-02-10 MED ORDER — SODIUM CHLORIDE 0.9 % IV SOLN: 250.0000 mL | INTRAVENOUS | Status: DC | PRN

## 2019-02-10 MED ORDER — NITROGLYCERIN 0.4 MG SL SUBL: 0.4000 mg | SUBLINGUAL_TABLET | SUBLINGUAL | 1 refills | Status: DC | PRN

## 2019-02-10 MED ORDER — ASPIRIN EC 81 MG PO TBEC: 81.0000 mg | DELAYED_RELEASE_TABLET | Freq: Every day | ORAL | Status: DC

## 2019-02-10 MED ORDER — FENTANYL CITRATE (PF) 100 MCG/2ML IJ SOLN
INTRAMUSCULAR | Status: AC
  Filled 2019-02-10: qty 2

## 2019-02-10 MED ORDER — HEPARIN (PORCINE) IN NACL 1000-0.9 UT/500ML-% IV SOLN
INTRAVENOUS | Status: AC
  Filled 2019-02-10: qty 1000

## 2019-02-10 MED ORDER — KETOROLAC TROMETHAMINE 15 MG/ML IJ SOLN: 15.0000 mg | Freq: Once | INTRAMUSCULAR | Status: DC

## 2019-02-10 MED ORDER — NITROGLYCERIN 0.4 MG SL SUBL: 0.4000 mg | SUBLINGUAL_TABLET | SUBLINGUAL | Status: DC | PRN

## 2019-02-10 MED ORDER — LOSARTAN POTASSIUM 50 MG PO TABS
100.0000 mg | ORAL_TABLET | Freq: Every day | ORAL | Status: DC
  Administered 2019-02-10 – 2019-02-11 (×2): 100 mg via ORAL
  Filled 2019-02-10 (×2): qty 2

## 2019-02-10 MED ORDER — HEPARIN SODIUM (PORCINE) 1000 UNIT/ML IJ SOLN
INTRAMUSCULAR | Status: DC | PRN
  Administered 2019-02-10: 5000 [IU] via INTRAVENOUS

## 2019-02-10 MED ORDER — SODIUM CHLORIDE 0.9% FLUSH
3.0000 mL | Freq: Two times a day (BID) | INTRAVENOUS | Status: DC
  Administered 2019-02-10: 3 mL via INTRAVENOUS

## 2019-02-10 MED FILL — METOPROLOL SUCCINATE ER 25: 25 | 30 days supply | Qty: 30 | Fill #0

## 2019-02-10 MED FILL — BRILINTA 90 MG TABLET: 90 | 30 days supply | Qty: 60 | Fill #0

## 2019-02-10 MED FILL — NITROGLYCERIN 0.4 MG TAB SL: 0.4 | 8 days supply | Qty: 25 | Fill #0

## 2019-02-10 SURGICAL SUPPLY — 26 items
BALLN SAPPHIRE 2.0X12 (BALLOONS) ×2
BALLN SAPPHIRE 3.0X15 (BALLOONS) ×2
BALLN WOLVERINE 3.00X10 (BALLOONS) ×2
BALLOON SAPPHIRE 2.0X12 (BALLOONS) ×1 IMPLANT
BALLOON SAPPHIRE 3.0X15 (BALLOONS) ×1 IMPLANT
BALLOON WOLVERINE 3.00X10 (BALLOONS) ×1 IMPLANT
CATH EXTRAC PRONTO 5.5F 138CM (CATHETERS) ×2 IMPLANT
CATH INFINITI 5 FR 3DRC (CATHETERS) ×2 IMPLANT
CATH INFINITI JR4 5F (CATHETERS) ×2 IMPLANT
CATH LAUNCHER 6FR EBU3.5 (CATHETERS) ×2 IMPLANT
CATH OPTICROSS 40MHZ (CATHETERS) ×2 IMPLANT
CATH OPTITORQUE TIG 4.0 5F (CATHETERS) ×2 IMPLANT
DEVICE RAD COMP TR BAND LRG (VASCULAR PRODUCTS) ×2 IMPLANT
GLIDESHEATH SLEND A-KIT 6F 22G (SHEATH) ×2 IMPLANT
GUIDEWIRE INQWIRE 1.5J.035X260 (WIRE) ×1 IMPLANT
INQWIRE 1.5J .035X260CM (WIRE) ×2
KIT ENCORE 26 ADVANTAGE (KITS) ×2 IMPLANT
KIT HEART LEFT (KITS) ×2 IMPLANT
KIT HEMO VALVE WATCHDOG (MISCELLANEOUS) ×2 IMPLANT
PACK CARDIAC CATHETERIZATION (CUSTOM PROCEDURE TRAY) ×2 IMPLANT
SHEATH PROBE COVER 6X72 (BAG) ×2 IMPLANT
SLED PULL BACK IVUS (MISCELLANEOUS) ×2 IMPLANT
STENT RESOLUTE ONYX 3.5X18 (Permanent Stent) ×2 IMPLANT
TRANSDUCER W/STOPCOCK (MISCELLANEOUS) ×2 IMPLANT
TUBING CIL FLEX 10 FLL-RA (TUBING) ×2 IMPLANT
WIRE RUNTHROUGH .014X180CM (WIRE) ×2 IMPLANT

## 2019-02-10 NOTE — ED Notes (Signed)
1 sl NTG given per cardiologist

## 2019-02-10 NOTE — ED Triage Notes (Signed)
Pt bib ems with chest pain. 325mg  asa, 2 NTG. 20g lwrist. States pain is worse laying down.

## 2019-02-10 NOTE — Consult Note (Signed)
Entered in error

## 2019-02-10 NOTE — Progress Notes (Signed)
Risk factors for CAD, including hypertension, hyperlipidemia, tobacco abuse. Known coronary calcification. EKG and symptoms appear to suggest pericarditis. If first Trop HS negative, will obtain CTA cor.  Nigel Mormon, MD [facility] [place] Pager: [phone] Office: [phone]

## 2019-02-10 NOTE — ED Provider Notes (Addendum)
[place] EMERGENCY DEPARTMENT Provider Note   CSN: [id number] Arrival date & time: 02/10/19  1312     History Chief Complaint  Patient presents with  . Chest Pain    ZAYSHAWN RANKIN is a 64 y.o. male.  HPI   This patient is a pleasant 64 year old male with a history of tobacco use hypertension and hyperlipidemia, no prior history of cardiac disease, states that he had a heart catheterization approximately 23 years ago but nothing since that time.  After having his colonoscopy yesterday he started to develop an abnormal sensation on the left side of his chest, it was an intense pain sharp and stabbing worse with laying down and better with sitting up.  It came on again last night, paramedics were called, the patient took ibuprofen and the pain completely went away.  Again it was positional worse with laying down and better with sitting up.  He woke up this morning feeling well, short time ago the patient developed an intense heaviness on his chest with radiation into the neck, he called the paramedics again who noted that he had an abnormal EKG with ST elevations diffusely.  Code STEMI was activated prehospital, the patient's symptoms had completely resolved by the time he arrived and at this time has no chest pain or shortness of breath.  He was given nitroglycerin in route and took 325 mg of baby aspirin prehospital.  He denies being a diabetic, denies exertional symptoms in general and has no swelling of the legs, no fevers or chills, no nausea or vomiting  Past Medical History:  Diagnosis Date  . Hyperlipidemia   . Hypertension   . Nephrolithiasis   . Pneumonia   . Psoriasis   . Tobacco abuse     There are no problems to display for this patient.   History reviewed. No pertinent surgical history.     History reviewed. No pertinent family history.  Social History   Tobacco Use  . Smoking status: Current Every Day Smoker    Packs/day: 1.00    Years:  43.00    Pack years: 43.00    Types: Cigarettes  . Smokeless tobacco: Never Used  Substance Use Topics  . Alcohol use: Yes  . Drug use: No    Home Medications Prior to Admission medications   Medication Sig Start Date End Date Taking? Authorizing Provider  amLODipine (NORVASC) 2.5 MG tablet Take 2.5 mg by mouth daily.    [provider]  aspirin EC 81 MG tablet Take 81 mg by mouth daily.    [provider]  Cholecalciferol (VITAMIN D3) 50 MCG (2000 UT) TABS Take by mouth.    [provider]  losartan (COZAAR) 100 MG tablet Take 100 mg by mouth daily.    [provider]  pravastatin (PRAVACHOL) 40 MG tablet Take 40 mg by mouth daily.    [provider]  sildenafil (REVATIO) 20 MG tablet Take 20 mg by mouth 3 (three) times daily.    [provider]  vitamin B-12 (CYANOCOBALAMIN) 1000 MCG tablet Take 1,000 mcg by mouth daily.    [provider]  zolpidem (AMBIEN) 10 MG tablet Take 10 mg by mouth at bedtime as needed for sleep.    [provider]    Allergies    Ceclor [cefaclor], Crestor [rosuvastatin], Lipitor [atorvastatin], and Zetia [ezetimibe]  Review of Systems   Review of Systems  All other systems reviewed and are negative.   Physical Exam Updated  Vital Signs BP 134/75   Pulse 64   Temp 98.3 F (36.8 C) (Oral)   Resp 14   SpO2 99%   Physical Exam Vitals and nursing note reviewed.  Constitutional:      General: He is not in acute distress.    Appearance: He is well-developed.  HENT:     Head: Normocephalic and atraumatic.     Mouth/Throat:     Pharynx: No oropharyngeal exudate.  Eyes:     General: No scleral icterus.       Right eye: No discharge.        Left eye: No discharge.     Conjunctiva/sclera: Conjunctivae normal.     Pupils: Pupils are equal, round, and reactive to light.  Neck:     Thyroid: No thyromegaly.     Vascular: No JVD.  Cardiovascular:     Rate and Rhythm: Normal  rate and regular rhythm.     Heart sounds: Normal heart sounds. No murmur. No friction rub. No gallop.   Pulmonary:     Effort: Pulmonary effort is normal. No respiratory distress.     Breath sounds: Normal breath sounds. No wheezing or rales.  Abdominal:     General: Bowel sounds are normal. There is no distension.     Palpations: Abdomen is soft. There is no mass.     Tenderness: There is no abdominal tenderness.  Musculoskeletal:        General: No tenderness. Normal range of motion.     Cervical back: Normal range of motion and neck supple.  Lymphadenopathy:     Cervical: No cervical adenopathy.  Skin:    General: Skin is warm and dry.     Findings: No erythema or rash.  Neurological:     Mental Status: He is alert.     Coordination: Coordination normal.  Psychiatric:        Behavior: Behavior normal.     ED Results / Procedures / Treatments   Labs (all labs ordered are listed, but only abnormal results are displayed) Labs Reviewed  CBC WITH DIFFERENTIAL/PLATELET - Abnormal; Notable for the following components:      Result Value   Abs Immature Granulocytes 0.14 (*)    All other components within normal limits  RESPIRATORY PANEL BY RT PCR (FLU A&B, COVID)  PROTIME-INR  COMPREHENSIVE METABOLIC PANEL  SEDIMENTATION RATE  LIPID PANEL  TROPONIN I (HIGH SENSITIVITY)    EKG EKG Interpretation  Date/Time:  Friday February 10 2019 14:24:27 EST Ventricular Rate:  53 PR Interval:    QRS Duration: 135 QT Interval:  402 QTC Calculation: 378 R Axis:   50 Text Interpretation: Sinus rhythm Consider left atrial enlargement Left bundle branch block Baseline wander in lead(s) II III aVF V3 V6 STEMI+ Confirmed by Noemi Chapel [phone]) on 02/10/2019 2:33:04 PM   Radiology DG Chest Port 1 View  Result Date: 02/10/2019 CLINICAL DATA:  Chest pain EXAM: PORTABLE CHEST 1 VIEW COMPARISON:  CT chest 12/14/2018, radiograph of the chest and left ribs 06/10/2011 FINDINGS: Heart size  within normal limits. Aortic atherosclerosis. There is no evidence of airspace consolidation. Mild biapical pleuroparenchymal scarring. No evidence of pleural effusion or pneumothorax. No acute bony abnormality. Overlying cardiac monitoring leads. IMPRESSION: No evidence of acute cardiopulmonary abnormality. Aortic atherosclerosis. Electronically Signed   By: Kellie Simmering DO   On: 02/10/2019 13:50    Procedures .Critical Care Performed by: Noemi Chapel, MD Authorized by: Noemi Chapel, MD   Critical care provider statement:  Critical care time (minutes):  35   Critical care time was exclusive of:  Separately billable procedures and treating other patients and teaching time   Critical care was necessary to treat or prevent imminent or life-threatening deterioration of the following conditions:  Cardiac failure   Critical care was time spent personally by me on the following activities:  Blood draw for specimens, development of treatment plan with patient or surrogate, discussions with consultants, evaluation of patient's response to treatment, examination of patient, obtaining history from patient or surrogate, ordering and performing treatments and interventions, ordering and review of laboratory studies, ordering and review of radiographic studies, pulse oximetry, re-evaluation of patient's condition and review of old charts   (including critical care time)  Medications Ordered in ED Medications  ketorolac (TORADOL) 15 MG/ML injection 15 mg (15 mg Intravenous Not Given 02/10/19 1429)  heparin bolus via infusion 4,000 Units (has no administration in time range)    ED Course  I have reviewed the triage vital signs and the nursing notes.  Pertinent labs & imaging results that were available during my care of the patient were reviewed by me and considered in my medical decision making (see chart for details).  Clinical Course as of Feb 10 1432  Fri Feb 10, 2019  1342 Dr. Rhodia Albright has  seen pt =- agreeable to trop and CT coronary if negative   [BM]    Clinical Course User Index [BM] Noemi Chapel, MD   MDM Rules/Calculators/A&P                      I detect no murmurs rubs or gallops on my clinical exam, the patient's EKG is definitely abnormal and could be consistent with pericarditis or ischemia.  Cardiology is at the bedside evaluating the patient at this time, the patient will need labs including a chest x-ray, troponin, basic metabolic panel, CBC, cardiac monitoring and repeat EKG.  He will likely need to be admitted to the hospital.  The patient's lab work is slowly coming back, normal CBC, negative for Covid, chest x-ray unremarkable.  His troponin is not yet resulted however on multiple repeat EKGs it is now evident that the patient has converted to a STEMI, this is very evident in the anterior leads.  Cardiology at the bedside to take the patient to the heart cath.  Requested pharmacy give heparin bolus in route.  The patient is critically ill  2:35 PM Cardiac monitoring reveals NSR - ST elevation (Rate & rhythm), as reviewed and interpreted by me. Cardiac monitoring was ordered due to Chest pain and to monitor patient for dysrhythmia.  Final Clinical Impression(s) / ED Diagnoses Final diagnoses:  ST elevation myocardial infarction (STEMI), unspecified artery [facility])    Rx / DC Orders ED Discharge Orders    None       Noemi Chapel, MD 02/10/19 1434    Noemi Chapel, MD 02/10/19 1435

## 2019-02-10 NOTE — H&P (Addendum)
[name] is an 64 y.o. male.   Chief Complaint: Chest pain HPI:   64 y.o. Caucasian male  with hypertension, hyperlipidemia, tobacco abuse, presents with chest pain.  Patient has been having chest pain since 02/08/2019.  Patient reports that he had before a colonoscopy.  From the same time, he started having pain which is dull, achy, worse with laying down, better with sitting up.  Pain comes in bouts of several seconds to several minutes at once.  Pain is better with ibuprofen.  Patient recently had CT chest for lung cancer screening that showed coronary and aortic atherosclerosis.   Patient was chest pain free on arrival. His initial EKG's were suspicious for pericarditis. However, while in the ED, he had another episode of chest pain. EKG at 14:24 showed massive ST elevations in anterolateral as well as inferior leads. He was taken to cath lab. 100% prox LAD occlusion was found which was treated with aspiration thrombectomy, PTCA and stenting with Resolute 3.5X18 mm DES.   Past Medical History:  Diagnosis Date  . Hyperlipidemia   . Hypertension   . Nephrolithiasis   . Pneumonia   . Psoriasis   . Tobacco abuse     History reviewed. No pertinent surgical history.  History reviewed. No pertinent family history. Social History:  reports that he has been smoking cigarettes. He has a 43.00 pack-year smoking history. He has never used smokeless tobacco. He reports current alcohol use. He reports that he does not use drugs.  Allergies:  Allergies  Allergen Reactions  . Ceclor [Cefaclor]   . Crestor [Rosuvastatin]   . Lipitor [Atorvastatin]   . Zetia [Ezetimibe]     Review of Systems  Constitution: Negative for decreased appetite, malaise/fatigue, weight gain and weight loss.  HENT: Negative for congestion.   Eyes: Negative for visual disturbance.  Cardiovascular: Positive for chest pain (Now resolved). Negative for dyspnea on exertion, leg swelling, palpitations and syncope.   Respiratory: Negative for cough.   Endocrine: Negative for cold intolerance.  Hematologic/Lymphatic: Does not bruise/bleed easily.  Skin: Negative for itching and rash.  Musculoskeletal: Negative for myalgias.  Gastrointestinal: Negative for abdominal pain, nausea and vomiting.  Genitourinary: Negative for dysuria.  Neurological: Negative for dizziness and weakness.  Psychiatric/Behavioral: The patient is not nervous/anxious.   All other systems reviewed and are negative.    Blood pressure 123/77, pulse 70, temperature 98.3 F (36.8 C), temperature source Oral, resp. rate 10, height 5\' 10"  (1.778 m), weight 83 kg, SpO2 98 %. Body mass index is 26.26 kg/m.  Physical Exam  Constitutional: He is oriented to person, place, and time. He appears well-developed and well-nourished. No distress.  HENT:  Head: Normocephalic and atraumatic.  Eyes: Pupils are equal, round, and reactive to light. Conjunctivae are normal.  Neck: No JVD present.  Cardiovascular: Normal rate, regular rhythm and intact distal pulses.  Pulmonary/Chest: Effort normal and breath sounds normal. He has no wheezes. He has no rales.  Abdominal: Soft. Bowel sounds are normal. There is no rebound.  Musculoskeletal:        General: No edema.  Lymphadenopathy:    He has no cervical adenopathy.  Neurological: He is alert and oriented to person, place, and time. No cranial nerve deficit.  Skin: Skin is warm and dry.  Psychiatric: He has a normal mood and affect.  Nursing note and vitals reviewed.   Results for orders placed or performed during the hospital encounter of 02/10/19 (from the past 48 hour(s))  CBC  with Differential/Platelet     Status: Abnormal   Collection Time: 02/10/19  1:30 PM  Result Value Ref Range   WBC 10.1 4.0 - 10.5 K/uL   RBC 4.79 4.22 - 5.81 MIL/uL   Hemoglobin 14.6 13.0 - 17.0 g/dL   HCT 45.1 39.0 - 52.0 %   MCV 94.2 80.0 - 100.0 fL   MCH 30.5 26.0 - 34.0 pg   MCHC 32.4 30.0 - 36.0 g/dL    RDW 12.3 11.5 - 15.5 %   Platelets 264 150 - 400 K/uL   nRBC 0.0 0.0 - 0.2 %   Neutrophils Relative % 66 %   Neutro Abs 6.7 1.7 - 7.7 K/uL   Lymphocytes Relative 21 %   Lymphs Abs 2.1 0.7 - 4.0 K/uL   Monocytes Relative 8 %   Monocytes Absolute 0.8 0.1 - 1.0 K/uL   Eosinophils Relative 3 %   Eosinophils Absolute 0.3 0.0 - 0.5 K/uL   Basophils Relative 1 %   Basophils Absolute 0.1 0.0 - 0.1 K/uL   Immature Granulocytes 1 %   Abs Immature Granulocytes 0.14 (H) 0.00 - 0.07 K/uL    Comment: Performed at [facility] [address] [place], [place] [address]  Comprehensive metabolic panel     Status: Abnormal   Collection Time: 02/10/19  1:30 PM  Result Value Ref Range   Sodium 142 135 - 145 mmol/L   Potassium 4.1 3.5 - 5.1 mmol/L   Chloride 109 98 - 111 mmol/L   CO2 24 22 - 32 mmol/L   Glucose, Bld 111 (H) 70 - 99 mg/dL   BUN 15 8 - 23 mg/dL   Creatinine, Ser 1.02 0.61 - 1.24 mg/dL   Calcium 9.3 8.9 - 10.3 mg/dL   Total Protein 6.7 6.5 - 8.1 g/dL   Albumin 4.0 3.5 - 5.0 g/dL   AST 21 15 - 41 U/L   ALT 24 0 - 44 U/L   Alkaline Phosphatase 42 38 - 126 U/L   Total Bilirubin 0.4 0.3 - 1.2 mg/dL   GFR calc non Af Amer >60 >60 mL/min   GFR calc Af Amer >60 >60 mL/min   Anion gap 9 5 - 15    Comment: Performed at [facility], [place] [address], [place], [place] [address]  Troponin I (High Sensitivity)     Status: Abnormal   Collection Time: 02/10/19  1:30 PM  Result Value Ref Range   Troponin I (High Sensitivity) 1,347 (HH) <18 ng/L    Comment: CRITICAL RESULT CALLED TO, READ BACK BY AND VERIFIED WITH: DALTON.N RN 1503 02/10/19 LANKFORDSANCHEZ.A (NOTE) Elevated high sensitivity troponin I (hsTnI) values and significant  changes across serial measurements may suggest ACS but many other  chronic and acute conditions are known to elevate hsTnI results.  Refer to the Links section for chest pain algorithms and additional  guidance. Performed at [facility],  [place] [address], [place], [place] [address]   Protime-INR     Status: None   Collection Time: 02/10/19  1:30 PM  Result Value Ref Range   Prothrombin Time 12.3 11.4 - 15.2 seconds   INR 0.9 0.8 - 1.2    Comment: (NOTE) INR goal varies based on device and disease states. Performed at [facility], [place] [address], [place], [place] [address]   Sedimentation rate     Status: None   Collection Time: 02/10/19  1:30 PM  Result Value Ref Range  Sed Rate 3 0 - 16 mm/hr    Comment: Performed at [facility], [place] [address], [place], [place] [address]  Lipid panel     Status: Abnormal   Collection Time: 02/10/19  1:31 PM  Result Value Ref Range   Cholesterol 143 0 - 200 mg/dL   Triglycerides 75 <150 mg/dL   HDL 34 (L) >40 mg/dL   Total CHOL/HDL Ratio 4.2 RATIO   VLDL 15 0 - 40 mg/dL   LDL Cholesterol 94 0 - 99 mg/dL    Comment:        Total Cholesterol/HDL:CHD Risk Coronary Heart Disease Risk Table                     Men   Women  1/2 Average Risk   3.4   3.3  Average Risk       5.0   4.4  2 X Average Risk   9.6   7.1  3 X Average Risk  23.4   11.0        Use the calculated Patient Ratio above and the CHD Risk Table to determine the patient's CHD Risk.        ATP III CLASSIFICATION (LDL):  <100     mg/dL   Optimal  100-129  mg/dL   Near or Above                    Optimal  130-159  mg/dL   Borderline  160-189  mg/dL   High  >190     mg/dL   Very High Performed at [facility] [address] [place], [place] [address]   Respiratory Panel by RT PCR (Flu A&B, Covid) - Nasopharyngeal Swab     Status: None   Collection Time: 02/10/19  1:38 PM   Specimen: Nasopharyngeal Swab  Result Value Ref Range   SARS Coronavirus 2 by RT PCR NEGATIVE NEGATIVE    Comment: (NOTE) SARS-CoV-2 target nucleic acids are NOT DETECTED. The SARS-CoV-2 RNA is generally detectable in upper respiratoy specimens during the acute phase of infection. The lowest concentration of SARS-CoV-2  viral copies this assay can detect is 131 copies/mL. A negative result does not preclude SARS-Cov-2 infection and should not be used as the sole basis for treatment or other patient management decisions. A negative result may occur with  improper specimen collection/handling, submission of specimen other than nasopharyngeal swab, presence of viral mutation(s) within the areas targeted by this assay, and inadequate number of viral copies (<131 copies/mL). A negative result must be combined with clinical observations, patient history, and epidemiological information. The expected result is Negative. Fact Sheet for Patients:  PinkCheek.be Fact Sheet for Healthcare Providers:  GravelBags.it This test is not yet ap proved or cleared by the [place] FDA and  has been authorized for detection and/or diagnosis of SARS-CoV-2 by FDA under an Emergency Use Authorization (EUA). This EUA will remain  in effect (meaning this test can be used) for the duration of the COVID-19 declaration under Section 564(b)(1) of the Act, 21 U.S.C. section 360bbb-3(b)(1), unless the authorization is terminated or revoked sooner.    Influenza A by PCR NEGATIVE NEGATIVE   Influenza B by PCR NEGATIVE NEGATIVE    Comment: (NOTE) The Xpert Xpress SARS-CoV-2/FLU/RSV assay is intended as an aid in  the diagnosis of influenza from Nasopharyngeal swab specimens and  should not be used as a sole basis for treatment. Nasal washings and  aspirates are unacceptable for Xpert Xpress SARS-CoV-2/FLU/RSV  testing. Fact Sheet for Patients: PinkCheek.be Fact Sheet for Healthcare Providers: GravelBags.it This test is not yet approved or cleared by the [place] FDA and  has been authorized for detection and/or diagnosis of SARS-CoV-2 by  FDA under an Emergency Use Authorization (EUA). This EUA will remain  in  effect (meaning this test can be used) for the duration of the  Covid-19 declaration under Section 564(b)(1) of the Act, 21  U.S.C. section 360bbb-3(b)(1), unless the authorization is  terminated or revoked. Performed at [facility], [place] [address] [place], [place] [address]     Labs:   Lab Results  Component Value Date   WBC 10.1 02/10/2019   HGB 14.6 02/10/2019   HCT 45.1 02/10/2019   MCV 94.2 02/10/2019   PLT 264 02/10/2019    Recent Labs  Lab 02/10/19 1330  NA 142  K 4.1  CL 109  CO2 24  BUN 15  CREATININE 1.02  CALCIUM 9.3  PROT 6.7  BILITOT 0.4  ALKPHOS 42  ALT 24  AST 21  GLUCOSE 111*    Lipid Panel     Component Value Date/Time   CHOL 143 02/10/2019 1331   TRIG 75 02/10/2019 1331   HDL 34 (L) 02/10/2019 1331   CHOLHDL 4.2 02/10/2019 1331   VLDL 15 02/10/2019 1331   LDLCALC 94 02/10/2019 1331   Results for NICHALOS, KOSUB (MRN [id number]) as of 02/10/2019 17:43  Ref. Range 02/10/2019 13:30  Troponin I (High Sensitivity) Latest Ref Range: <18 ng/L 1,347 (HH)    Medications Prior to Admission  Medication Sig Dispense Refill  . amLODipine (NORVASC) 2.5 MG tablet Take 2.5 mg by mouth daily.    Marland Kitchen aspirin EC 81 MG tablet Take 81 mg by mouth daily.    . Cholecalciferol (VITAMIN D3) 50 MCG (2000 UT) TABS Take by mouth.    . losartan (COZAAR) 100 MG tablet Take 100 mg by mouth daily.    . pravastatin (PRAVACHOL) 40 MG tablet Take 40 mg by mouth daily.    . sildenafil (REVATIO) 20 MG tablet Take 20 mg by mouth 3 (three) times daily.    . vitamin B-12 (CYANOCOBALAMIN) 1000 MCG tablet Take 1,000 mcg by mouth daily.    Marland Kitchen zolpidem (AMBIEN) 10 MG tablet Take 10 mg by mouth at bedtime as needed for sleep.        Current Facility-Administered Medications:  .  acetaminophen (TYLENOL) tablet 650 mg, 650 mg, Oral, Q4H PRN, Treylen Gibbs J, MD .  aspirin EC tablet 81 mg, 81 mg, Oral, Daily, Marven Veley J, MD .  heparin injection 5,000 Units,  5,000 Units, Subcutaneous, Q8H, Eladio Dentremont J, MD .  Doug Sou Hold] ketorolac (TORADOL) 15 MG/ML injection 15 mg, 15 mg, Intravenous, Once, Shariff Lasky J, MD .  losartan (COZAAR) tablet 100 mg, 100 mg, Oral, Daily, Cova Knieriem J, MD .  Derrill Memo ON 02/11/2019] metoprolol succinate (TOPROL-XL) 24 hr tablet 25 mg, 25 mg, Oral, Daily, Abeer Deskins J, MD .  nitroGLYCERIN (NITROSTAT) SL tablet 0.4 mg, 0.4 mg, Sublingual, Q5 Min x 3 PRN, Benjy Kana J, MD .  ondansetron (ZOFRAN) injection 4 mg, 4 mg, Intravenous, Q6H PRN, Zayde Stroupe J, MD .  pravastatin (PRAVACHOL) tablet 40 mg, 40 mg, Oral, Daily, Weda Baumgarner J, MD .  vitamin B-12 (CYANOCOBALAMIN) tablet 1,000 mcg, 1,000 mcg, Oral, Daily, Nicolis Boody J, MD .  zolpidem (AMBIEN) tablet 10 mg, 10 mg, Oral,  QHS PRN, Nigel Mormon, MD   Today's Vitals   02/10/19 1605 02/10/19 1606 02/10/19 1610 02/10/19 1611  BP: 124/78 122/77  123/77  Pulse: 69 67  70  Resp: (!) 23 13  10   Temp:      TempSrc:      SpO2: 100% 100%  98%  Weight:      Height:      PainSc:   0-No pain    Body mass index is 26.26 kg/m.    CARDIAC STUDIES:  Post PCI EKG 1620: Sinus rhythm. Nonspecific ST-T changes.  STE resolved.   Coronary angiography/intervention 02/10/2019: STEMI in evolution  LM: Normal LAD: Prox 100% occlusion. Mid 40% stenosis. Ramus: Mid 40% stenosis LCx: OM2 lateral branch with focal 40% stenosis. RCA: Minimal luminal irregularities.   Successful percutaneous coronary intervention pLAD Mechanical thrombectomy, PTCA and stent placement Resolute 3.5 X 18 mm Residual mild moderate disease best treated medically.   EKG 02/10/2019 1424: Acute anterolateral and inferior STEMI  EKG 02/10/2019 1319: Minimal diffuse ST elevation. No reciprocal changes.    Echocardiogram pending:   Assessment/Plan  64 y.o. Caucasian male  with hypertension, hyperlipidemia, tobacco abuse, presents with  chest pain.  STEMI: Prox LAD 100% stenosis Successful percutaneous coronary intervention pLAD Mechanical thrombectomy, PTCA and stent placement Resolute 3.5 X 18 mm Residual mild moderate disease best treated medically. Stopped amlodipine.  DAPT, metoprolol succinate 25 mg, resume losartan 100 mg tomorrow. Intolerant to lipitor and crestor. Resume pravastatin. Will start [facility] outpatient. Smoking cessation.  CRITICAL CARE Performed by: Vernell Leep   Total critical care time: 45 minutes  Initial presentation suspicious for pericarditis, requiring serial EKG and close clinical monitoring for STEMI in evolution.   Critical care time was exclusive of separately billable procedures and treating other patients.   Critical care was necessary to treat or prevent imminent or life-threatening deterioration.   Critical care was time spent personally by me on the following activities: development of treatment plan with patient and/or surrogate as well as nursing, discussions with consultants, evaluation of patient's response to treatment, examination of patient, obtaining history from patient or surrogate, ordering and performing treatments and interventions, ordering and review of laboratory studies, ordering and review of radiographic studies, pulse oximetry and re-evaluation of patient's condition.     Nigel Mormon, MD 02/10/2019, 4:44 PM [facility] Pager: [phone] Office: [phone] If no answer: [phone]

## 2019-02-10 NOTE — ED Notes (Signed)
Pt given 4000 units heparin bolus

## 2019-02-10 NOTE — Progress Notes (Signed)
TR band removed at 2030. Site is a level 0 and patient is hemodynamically stable. Pressure dressing applied and patient instructed not to use affected arm for 24 hours.

## 2019-02-11 ENCOUNTER — Inpatient Hospital Stay (HOSPITAL_COMMUNITY): Payer: 59

## 2019-02-11 ENCOUNTER — Encounter: Payer: Self-pay | Admitting: Cardiology

## 2019-02-11 LAB — ECHOCARDIOGRAM COMPLETE
Height: 70 in
Weight: 2927.71 oz

## 2019-02-11 LAB — CBC
HCT: 40.8 % (ref 39.0–52.0)
Hemoglobin: 13.7 g/dL (ref 13.0–17.0)
MCH: 30.6 pg (ref 26.0–34.0)
MCHC: 33.6 g/dL (ref 30.0–36.0)
MCV: 91.1 fL (ref 80.0–100.0)
Platelets: 251 10*3/uL (ref 150–400)
RBC: 4.48 MIL/uL (ref 4.22–5.81)
RDW: 12.5 % (ref 11.5–15.5)
WBC: 12.5 10*3/uL — ABNORMAL HIGH (ref 4.0–10.5)
nRBC: 0 % (ref 0.0–0.2)

## 2019-02-11 LAB — BASIC METABOLIC PANEL
Anion gap: 7 (ref 5–15)
BUN: 13 mg/dL (ref 8–23)
CO2: 23 mmol/L (ref 22–32)
Calcium: 8.9 mg/dL (ref 8.9–10.3)
Chloride: 110 mmol/L (ref 98–111)
Creatinine, Ser: 0.93 mg/dL (ref 0.61–1.24)
GFR calc Af Amer: >60 mL/min (ref >60)
GFR calc non Af Amer: >60 mL/min (ref >60)
Glucose, Bld: 134 mg/dL — ABNORMAL HIGH (ref 70–99)
Potassium: 3.5 mmol/L (ref 3.5–5.1)
Sodium: 140 mmol/L (ref 135–145)

## 2019-02-11 LAB — LIPID PANEL
Cholesterol: 145 mg/dL (ref 0–200)
HDL: 28 mg/dL — ABNORMAL LOW (ref >40)
LDL Cholesterol: 91 mg/dL (ref 0–99)
Total CHOL/HDL Ratio: 5.2 RATIO
Triglycerides: 128 mg/dL (ref <150)
VLDL: 26 mg/dL (ref 0–40)

## 2019-02-11 NOTE — Progress Notes (Signed)
  Echocardiogram 2D Echocardiogram has been performed.  [name] 02/11/2019, 1:01 PM

## 2019-02-11 NOTE — Plan of Care (Signed)
  Problem: Education: Goal: Knowledge of General Education information will improve Description: Including pain rating scale, medication(s)/side effects and non-pharmacologic comfort measures Outcome: Adequate for Discharge   Problem: Health Behavior/Discharge Planning: Goal: Ability to manage health-related needs will improve Outcome: Adequate for Discharge   Problem: Clinical Measurements: Goal: Ability to maintain clinical measurements within normal limits will improve Outcome: Adequate for Discharge Goal: Will remain free from infection Outcome: Adequate for Discharge Goal: Diagnostic test results will improve Outcome: Adequate for Discharge Goal: Respiratory complications will improve Outcome: Adequate for Discharge Goal: Cardiovascular complication will be avoided Outcome: Adequate for Discharge   Problem: Activity: Goal: Risk for activity intolerance will decrease Outcome: Adequate for Discharge   Problem: Nutrition: Goal: Adequate nutrition will be maintained Outcome: Adequate for Discharge   Problem: Coping: Goal: Level of anxiety will decrease Outcome: Adequate for Discharge   Problem: Elimination: Goal: Will not experience complications related to bowel motility Outcome: Adequate for Discharge Goal: Will not experience complications related to urinary retention Outcome: Adequate for Discharge   Problem: Pain Managment: Goal: General experience of comfort will improve Outcome: Adequate for Discharge   Problem: Skin Integrity: Goal: Risk for impaired skin integrity will decrease Outcome: Adequate for Discharge   Problem: Education: Goal: Understanding of cardiac disease, CV risk reduction, and recovery process will improve Outcome: Adequate for Discharge Goal: Understanding of medication regimen will improve Outcome: Adequate for Discharge Goal: Individualized Educational Video(s) Outcome: Adequate for Discharge   Problem: Activity: Goal: Ability to  tolerate increased activity will improve Outcome: Adequate for Discharge   Problem: Cardiac: Goal: Ability to achieve and maintain adequate cardiopulmonary perfusion will improve Outcome: Adequate for Discharge Goal: Vascular access site(s) Level 0-1 will be maintained Outcome: Adequate for Discharge   Problem: Health Behavior/Discharge Planning: Goal: Ability to safely manage health-related needs after discharge will improve Outcome: Adequate for Discharge

## 2019-02-11 NOTE — Discharge Summary (Signed)
Physician Discharge Summary  Patient ID: [name] MRN: [id number] DOB/AGE: May 26, 1955 64 y.o.  Admit date: 02/10/2019 Discharge date: 02/11/2019  Primary Discharge Diagnosis: STEMI  Secondary Discharge Diagnosis: Hypertension Tobacco dependence  Dyslipidemia  Hospital Course:   64 y.o.Caucasianmalewith hypertension, hyperlipidemia, tobacco abuse, presents with chest pain.  Patient has been having chest pain since 02/08/2019. Patient reports that he had before a colonoscopy. From the same time, he started having pain which is dull, achy, worse with laying down, better with sitting up. Pain comes in bouts of several seconds to several minutes at once.Pain is better with ibuprofen. Patient recently had CT chest for lung cancer screening that showed coronary and aortic atherosclerosis.   Patient was chest pain free on arrival. His initial EKG's were suspicious for pericarditis. However, while in the ED, he had another episode of chest pain. EKG at 14:24 showed massive ST elevations in anterolateral as well as inferior leads. He was taken to cath lab. 100% prox LAD occlusion was found which was treated with aspiration thrombectomy, PTCA and stenting with Resolute 3.5X18 mm DES.   Patient did well rest of the hospital stay. Echo showed EF 45-50% with moderate mid to apical anteroseptal hypokinesis. Patient ambulated with cardiac rehab and was chest pain free on the day of discharge.   Did the patient have an acute coronary syndrome (MI, NSTEMI, STEMI, etc) this admission?: Yes                               AHA/ACC Clinical Performance & Quality Measures: 1. Aspirin prescribed? - Yes 2. ADP Receptor Inhibitor (Plavix/Clopidogrel, Brilinta/Ticagrelor or Effient/Prasugrel) prescribed (includes medically managed patients)? - Yes 3. Beta Blocker prescribed? - Yes 4. High Intensity Statin (Lipitor 40-80mg  or Crestor 20-40mg ) prescribed? - Yes 5. EF assessed during THIS  hospitalization? - Yes 6. For EF <40%, was ACEI/ARB prescribed? - Yes 7. For EF <40%, Aldosterone Antagonist (Spironolactone or Eplerenone) prescribed? - Not Applicable (EF >/= [id number]) 8. Cardiac Rehab Phase II ordered (Included Medically managed Patients)? - Yes     Discharge Exam: Blood pressure (!) 148/95, pulse 84, temperature 98 F (36.7 C), temperature source Oral, resp. rate 18, height 5\' 10"  (1.778 m), weight 83 kg, SpO2 97 %.   Physical Exam  Constitutional: He is oriented to person, place, and time. He appears well-developed and well-nourished. No distress.  HENT:  Head: Normocephalic and atraumatic.  Eyes: Pupils are equal, round, and reactive to light. Conjunctivae are normal.  Neck: No JVD present.  Cardiovascular: Normal rate, regular rhythm and intact distal pulses.  No murmur heard. Pulmonary/Chest: Effort normal and breath sounds normal. He has no wheezes. He has no rales.  Abdominal: Soft. Bowel sounds are normal. There is no rebound.  Musculoskeletal:        General: No edema.  Lymphadenopathy:    He has no cervical adenopathy.  Neurological: He is alert and oriented to person, place, and time. No cranial nerve deficit.  Skin: Skin is warm and dry.  Psychiatric: He has a normal mood and affect.  Nursing note and vitals reviewed.    Significant Diagnostic Studies:  EKG 02/11/2019: Sinus rhythm Nonspecific ST-T changes.   1. Normal size LV. Moderate hypokinesis mid to distal anteroseptal wall.  LVEF 45-50%. Grade 1 diastolic dysfunction.  2. Normal RV size and function.  3. Mild mitral leaflet thickening.  4. Mild tricuspid regurgitation. Estimated PASP 32 mmHg.   Post PCI  EKG 1620: Sinus rhythm. Nonspecific ST-T changes.  STE resolved.   Coronary angiography/intervention 02/10/2019: STEMI in evolution  LM: Normal LAD: Prox 100% occlusion. Mid 40% stenosis. Ramus: Mid 40% stenosis LCx: OM2 lateral branch with focal 40% stenosis. RCA: Minimal  luminal irregularities.   Successful percutaneous coronary intervention pLAD Mechanical thrombectomy, PTCA and stent placement Resolute 3.5 X 18 mm Residual mild moderate disease best treated medically.   EKG 02/10/2019 1424: Acute anterolateral and inferior STEMI  EKG 02/10/2019 1319: Minimal diffuse ST elevation. No reciprocal changes.     Labs:   Lab Results  Component Value Date   WBC 12.5 (H) 02/11/2019   HGB 13.7 02/11/2019   HCT 40.8 02/11/2019   MCV 91.1 02/11/2019   PLT 251 02/11/2019    Recent Labs  Lab 02/10/19 1330 02/10/19 1451 02/11/19 0227  NA 142   < > 140  K 4.1   < > 3.5  CL 109   < > 110  CO2 24   < > 23  BUN 15   < > 13  CREATININE 1.02   < > 0.93  CALCIUM 9.3   < > 8.9  PROT 6.7  --   --   BILITOT 0.4  --   --   ALKPHOS 42  --   --   ALT 24  --   --   AST 21  --   --   GLUCOSE 111*   < > 134*   < > = values in this interval not displayed.    Lipid Panel     Component Value Date/Time   CHOL 145 02/11/2019 0227   TRIG 128 02/11/2019 0227   HDL 28 (L) 02/11/2019 0227   CHOLHDL 5.2 02/11/2019 0227   VLDL 26 02/11/2019 0227   LDLCALC 91 02/11/2019 0227    BNP (last 3 results) No results for input(s): BNP in the last 8760 hours.  HEMOGLOBIN A1C Lab Results  Component Value Date   HGBA1C 5.7 (H) 02/10/2019   MPG 116.89 02/10/2019    Cardiac Panel (last 3 results) No results for input(s): CKTOTAL, CKMB, TROPONINI, RELINDX in the last 8760 hours.  No results found for: CKTOTAL, CKMB, CKMBINDEX, TROPONINI   TSH No results for input(s): TSH in the last 8760 hours.  Radiology: CARDIAC CATHETERIZATION  Result Date: 02/10/2019 STEMI in evolution LM: Normal LAD: Prox 100% occlusion. Mid 40% stenosis. Ramus: Mid 40% stenosis LCx: OM2 lateral branch with focal 40% stenosis. RCA: Minimal luminal irregularities. Successful percutaneous coronary intervention pLAD Mechanical thrombectomy, PTCA and stent placement Resolute 3.5 X 18 mm  Residual mild moderate disease best treated medically. Nigel Mormon, MD [facility] [place] Pager: [phone] Office: [phone]    DG Chest Port 1 View  Result Date: 02/10/2019 CLINICAL DATA:  Chest pain EXAM: PORTABLE CHEST 1 VIEW COMPARISON:  CT chest 12/14/2018, radiograph of the chest and left ribs 06/10/2011 FINDINGS: Heart size within normal limits. Aortic atherosclerosis. There is no evidence of airspace consolidation. Mild biapical pleuroparenchymal scarring. No evidence of pleural effusion or pneumothorax. No acute bony abnormality. Overlying cardiac monitoring leads. IMPRESSION: No evidence of acute cardiopulmonary abnormality. Aortic atherosclerosis. Electronically Signed   By: Kellie Simmering DO   On: 02/10/2019 13:50   ECHOCARDIOGRAM COMPLETE  Result Date: 02/11/2019   ECHOCARDIOGRAM REPORT   Patient Name:   [name] Date of Exam: [id number] Medical Rec #:  [id number]     Height:       70.0 in Accession #:  [id number]    Weight:       183.0 lb Date of Birth:  September 19, 1955      BSA:          2.01 m Patient Age:    89 years      BP:           148/95 mmHg Patient Gender: M             HR:           81 bpm. Exam Location:  Inpatient Procedure: 2D Echo Indications:     STEMI  History:         Patient has no prior history of Echocardiogram examinations.                  Signs/Symptoms:Chest Pain.  Sonographer:     Johny Chess RDCS Referring Phys:  [id number] [facility] [place] [facility] Diagnosing Phys: Vernell Leep MD IMPRESSIONS  1. Normal size LV. Moderate hypokinesis mid to distal anteroseptal wall. LVEF 45-50%. Grade 1 diastolic dysfunction.  2. Normal RV size and function.  3. Mild mitral leaflet thickening.  4. Mild tricuspid regurgitation. Estimated PASP 32 mmHg. FINDINGS  Left Ventricle: Left ventricular ejection fraction, by visual estimation, is 45 to 50%. The left ventricle has mildly decreased function. Moderate hypokinesis of the left ventricular, mid-apical  anteroseptal wall. The left ventricle demonstrates regional wall motion abnormalities. There is no left ventricular hypertrophy. Left ventricular diastolic parameters are consistent with Grade I diastolic dysfunction (impaired relaxation). Right Ventricle: The right ventricular size is normal. No increase in right ventricular wall thickness. Global RV systolic function is has normal systolic function. The tricuspid regurgitant velocity is 2.70 m/s, and with an assumed right atrial pressure  of 3 mmHg, the estimated right ventricular systolic pressure is mildly elevated at 32.2 mmHg. Left Atrium: Left atrial size was normal in size. Right Atrium: Right atrial size was normal in size Pericardium: There is no evidence of pericardial effusion. Mitral Valve: The mitral valve is normal in structure. There is mild thickening of the mitral valve leaflet(s). No evidence of mitral valve regurgitation. Tricuspid Valve: The tricuspid valve is grossly normal. Tricuspid valve regurgitation is mild. Aortic Valve: The aortic valve is normal in structure. Aortic valve regurgitation is not visualized. Pulmonic Valve: The pulmonic valve was grossly normal. Pulmonic valve regurgitation is not visualized. Pulmonic regurgitation is not visualized. Aorta: The aortic root is normal in size and structure. Venous: The inferior vena cava was not well visualized. IAS/Shunts: No atrial level shunt detected by color flow Doppler.  LEFT VENTRICLE PLAX 2D LVIDd:         4.80 cm       Diastology LVIDs:         2.90 cm       LV e' lateral:   8.38 cm/s LV PW:         1.00 cm       LV E/e' lateral: 9.0 LV IVS:        0.90 cm       LV e' medial:    6.20 cm/s LVOT diam:     2.10 cm       LV E/e' medial:  12.2 LV SV:         75 ml LV SV Index:   36.98 LVOT Area:     3.46 cm  LV Volumes (MOD) LV area d, A2C:    34.70 cm LV area d, A4C:    33.40 cm  LV area s, A2C:    22.00 cm LV area s, A4C:    24.10 cm LV major d, A2C:   9.54 cm LV major d, A4C:    9.09 cm LV major s, A2C:   8.50 cm LV major s, A4C:   8.31 cm LV vol d, MOD A2C: 105.0 ml LV vol d, MOD A4C: 102.0 ml LV vol s, MOD A2C: 47.0 ml LV vol s, MOD A4C: 58.0 ml LV SV MOD A2C:     58.0 ml LV SV MOD A4C:     102.0 ml LV SV MOD BP:      53.6 ml RIGHT VENTRICLE RV S prime:     16.20 cm/s TAPSE (M-mode): 2.2 cm LEFT ATRIUM             Index       RIGHT ATRIUM           Index LA diam:        3.80 cm 1.89 cm/m  RA Area:     13.90 cm LA Vol (A2C):   51.4 ml 25.57 ml/m RA Volume:   35.90 ml  17.86 ml/m LA Vol (A4C):   61.1 ml 30.40 ml/m LA Biplane Vol: 57.2 ml 28.46 ml/m  AORTIC VALVE LVOT Vmax:   155.00 cm/s LVOT Vmean:  95.900 cm/s LVOT VTI:    0.275 m  AORTA Ao Root diam: 3.40 cm Ao Asc diam:  3.30 cm MITRAL VALVE                        TRICUSPID VALVE MV Area (PHT): 3.21 cm             TR Peak grad:   29.2 mmHg MV PHT:        68.44 msec           TR Vmax:        270.00 cm/s MV Decel Time: 236 msec MV E velocity: 75.80 cm/s 103 cm/s  SHUNTS MV A velocity: 95.10 cm/s 70.3 cm/s Systemic VTI:  0.28 m MV E/A ratio:  0.80       1.5       Systemic Diam: 2.10 cm  Tykesha Konicki MD Electronically signed by Vernell Leep MD Signature Date/Time: 02/11/2019/1:16:35 PM    Final       FOLLOW UP PLANS AND APPOINTMENTS Discharge Instructions    Amb Referral to Cardiac Rehabilitation   Complete by: As directed    Diagnosis:  Coronary Stents STEMI PTCA     After initial evaluation and assessments completed: Virtual Based Care may be provided alone or in conjunction with Phase 2 Cardiac Rehab based on patient barriers.: Yes   Call MD for:   Complete by: As directed    Chest pain Shortness of breath   Diet - low sodium heart healthy   Complete by: As directed    Increase activity slowly   Complete by: As directed      Allergies as of 02/11/2019      Reactions   Ceclor [cefaclor] Other (See Comments)   Reaction not recalled by the patient   Crestor [rosuvastatin] Other (See Comments)    Muscle aches/pains   Lipitor [atorvastatin]    Muscle aches/pains   Zetia [ezetimibe] Other (See Comments)   Muscle aches/pains      Medication List    STOP taking these medications   amLODipine 2.5 MG tablet Commonly known as: NORVASC   sildenafil 20 MG  tablet Commonly known as: REVATIO     TAKE these medications   acetaminophen 500 MG tablet Commonly known as: TYLENOL Take 500-1,000 mg by mouth every 6 (six) hours as needed (for headaches).   aspirin EC 81 MG tablet Take 81 mg by mouth daily.   losartan 100 MG tablet Commonly known as: COZAAR Take 100 mg by mouth daily.   metoprolol succinate 25 MG 24 hr tablet Commonly known as: Toprol XL Take 1 tablet (25 mg total) by mouth daily.   nitroGLYCERIN 0.4 MG SL tablet Commonly known as: Nitrostat Place 1 tablet (0.4 mg total) under the tongue every 5 (five) minutes as needed for chest pain.   pravastatin 40 MG tablet Commonly known as: PRAVACHOL Take 40 mg by mouth daily.   PreserVision AREDS 2 Caps Take 1 capsule by mouth daily.   ticagrelor 90 MG Tabs tablet Commonly known as: Brilinta Take 1 tablet (90 mg total) by mouth 2 (two) times daily.   vitamin B-12 1000 MCG tablet Commonly known as: CYANOCOBALAMIN Take 1,000 mcg by mouth daily.   Vitamin D3 50 MCG (2000 UT) Tabs Take 2,000 Units by mouth daily.   zolpidem 10 MG tablet Commonly known as: AMBIEN Take 10 mg by mouth at bedtime as needed for sleep.      Will schedule outpatient follow up in 1-2 weeks.    Vernell Leep MD, [facility] Pager: [phone] Office: [phone] If no answer: [phone]

## 2019-02-11 NOTE — Care Management (Signed)
Patient given Brilinta cards.  Patient will present at local pharmacy today and transfer to [place].  Patient will notify MD if copay is unaffordable.

## 2019-02-11 NOTE — Progress Notes (Signed)
CARDIAC REHAB PHASE I   PRE:  Rate/Rhythm: 83 SR    BP: sitting 149/84    SaO2:   MODE:  Ambulation: 400 ft   POST:  Rate/Rhythm: 102 ST    BP: sitting 151/104     SaO2:   Tolerated well, no c/o. BP elevated. Pt sts he didn't sleep last night, used to night shift. Hasn't received meds yet. Denied CP/SOB. Discussed MI, stent, restrictions, Brilinta, smoking cessation, diet, exercise, NTG, and CRPII. Good reception. Understands the importance of Brilinta. Very motivated to quit smoking. Needs diet work. Will refer to [facility].  Pt is interested in participating in Virtual Cardiac and Pulmonary Rehab. Pt advised that Virtual Cardiac and Pulmonary Rehab is provided at no cost to the patient.  Checklist:  1. Pt has smart device  ie smartphone and/or ipad for downloading an app  Yes 2. Reliable internet/wifi service    Yes 3. Understands how to use their smartphone and navigate within an app.  Yes Pt verbalized understanding and is in agreement.  Cecilia, ACSM 02/11/2019 9:18 AM

## 2019-02-14 ENCOUNTER — Encounter (HOSPITAL_COMMUNITY): Payer: Self-pay | Admitting: *Deleted

## 2019-02-14 ENCOUNTER — Telehealth: Payer: Self-pay

## 2019-02-14 NOTE — Progress Notes (Signed)
Referral received and verified for MD signature.  Follow up appt is 02/20/19.  Pt has Abdominal CT on 2/5 for kidney stones.  Due to the senior leadership directed instructions to pause onsite cardiac rehab in response to the surge of Covid + patients within the health system staff were deployed to support health system and community needs.  Will wait to check pt insurance benefits and eligibility once onsite cardiac rehab is permitted to reopen. Pt will be contacted for scheduling Virtual [place] upon satisfactorily completing their follow up appt on 2/8  [place], BSN Cardiac and Pulmonary Rehab Nurse Navigator

## 2019-02-14 NOTE — Telephone Encounter (Signed)
Patient called and left a message about a having recently had a HA and had a stent placed, on Friday and is having fevers since being discharged on Saturday. Patient is asking, should he be concerned about anything. Please advise.   Temperatures since discharge.  Saturday 101.8 Sunday 101.6 Monday AM:  99.5 Monday PM:101.3 Tuesday PM 100.3 Today 99.4

## 2019-02-14 NOTE — Telephone Encounter (Signed)
I spoke with the patient. Note for documentation only.  4 days of fever spikes with occasional chills. Mild shortness of breath. No cough, abdominal symptoms, dysuria. Cannot explain fevers 4 days out of MI. He will likely need infectious workup including COVID test. He will be setting up appt with his PCP Dr. Rockwell [place].   Nigel Mormon, MD

## 2019-02-17 ENCOUNTER — Other Ambulatory Visit: Payer: Self-pay

## 2019-02-17 ENCOUNTER — Ambulatory Visit
Admission: RE | Admit: 2019-02-17 | Discharge: 2019-02-17 | Disposition: A | Discharge status: Home / Self Care | Payer: 59 | Source: Ambulatory Visit | Attending: Family Medicine | Admitting: Family Medicine

## 2019-02-17 DIAGNOSIS — N289 Disorder of kidney and ureter, unspecified: Secondary | ICD-10-CM

## 2019-02-17 MED ORDER — GADOBENATE DIMEGLUMINE 529 MG/ML IV SOLN
17.0000 mL | Freq: Once | INTRAVENOUS | Status: AC | PRN
  Administered 2019-02-17: 17 mL via INTRAVENOUS

## 2019-02-20 ENCOUNTER — Telehealth: Payer: Self-pay

## 2019-02-20 ENCOUNTER — Encounter: Payer: Self-pay | Admitting: Cardiology

## 2019-02-20 ENCOUNTER — Other Ambulatory Visit: Payer: Self-pay

## 2019-02-20 ENCOUNTER — Ambulatory Visit (INDEPENDENT_AMBULATORY_CARE_PROVIDER_SITE_OTHER): Payer: 59 | Admitting: Cardiology

## 2019-02-20 VITALS — BP 141/79 | HR 66 | Temp 97.7°F | Ht 70.0 in | Wt 186.0 lb

## 2019-02-20 DIAGNOSIS — I25118 Atherosclerotic heart disease of native coronary artery with other forms of angina pectoris: Secondary | ICD-10-CM | POA: Insufficient documentation

## 2019-02-20 DIAGNOSIS — R0609 Other forms of dyspnea: Secondary | ICD-10-CM | POA: Diagnosis not present

## 2019-02-20 DIAGNOSIS — I10 Essential (primary) hypertension: Secondary | ICD-10-CM

## 2019-02-20 DIAGNOSIS — I2510 Atherosclerotic heart disease of native coronary artery without angina pectoris: Secondary | ICD-10-CM

## 2019-02-20 DIAGNOSIS — E782 Mixed hyperlipidemia: Secondary | ICD-10-CM | POA: Diagnosis not present

## 2019-02-20 MED ORDER — CARVEDILOL 6.25 MG PO TABS: 6.2500 mg | ORAL_TABLET | Freq: Two times a day (BID) | ORAL | 3 refills | Status: DC

## 2019-02-20 MED ORDER — ROSUVASTATIN CALCIUM 10 MG PO TABS: 10.0000 mg | ORAL_TABLET | Freq: Every day | ORAL | 3 refills | Status: DC

## 2019-02-20 NOTE — Patient Instructions (Signed)
Dietary recommendation The 2019 ACC/AHA guidelines promote nutrition as a main fixture of cardiovascular wellness, with a recommendation for a varied diet of fruit, vegetables, fish, legumes, and whole grains (Class I), as well as recommendations to reduce sodium, cholesterol, processed meats, and refined sugars (Class IIa recommendation).10 Sodium intake, a topic of some controversy as of late, is recommended to be kept at 1,500 mg/day or less, far below the average daily intake in the [place] of 3,409 mg/day, and notably below that of previous US recommendations for <2,300mg/day.10,11 For those unable to reach 1,500 mg/day, they recommend at least a reduction of 1000 mg/day.  A Pesco-Mediterranean Diet With Intermittent Fasting: JACC Review Topic of the Week. J Am Coll Cardiol 2020;76:1484-1493 Pesco-Mediterranean diet, it is supplemented with extra-virgin olive oil (EVOO), which is the principle fat source, along with moderate amounts of dairy (particularly yogurt and cheese) and eggs, as well as modest amounts of alcohol consumption (ideally red wine with the evening meal), but few red and processed meats.  

## 2019-02-20 NOTE — Progress Notes (Signed)
Follow up visit  Subjective:   [name], male    DOB: [birth year], 64 y.o.   MRN: [id number]   HPI  Chief Complaint  Patient presents with  . Hypertension  . Hospitalization Follow-up    7-10 days     64 y.o.Caucasianmalewith hypertension, hyperlipidemia, CAD (STEMI 01/2019) treated with primary PCI to pLAD.   Patient had been having chest pain since 02/08/2019, which was dull, achy, worse with laying down, better with sitting up. Patient was chest pain free on arrival. His initial EKG's were suspicious for pericarditis. However, while in the ED, he had another episode of chest pain. EKG at 14:24 showed massive ST elevations in anterolateral as well as inferior leads. He was taken to cath lab. 100% prox LAD occlusion was found which was treated with aspiration thrombectomy, PTCA and stenting with Resolute 3.5X18 mm DES.  Patient did well rest of the hospital stay. Echo showed EF 45-50% with moderate mid to apical anteroseptal hypokinesis. Patient ambulated with cardiac rehab and was chest pain free on the day of discharge.   Since discharge, he had 4 days of temperature >100 F, highest being 101.8 F. He did not have any other infectious symptoms. He had a virtual visit with his PCP. Monitoring was recommended. He has not had any fever since then.  Patient has not had any chest pain since his hospital discharge.  He notices shortness of breath while talking on the phone.  He has only walked to his mailbox and back.  With this level of activity, he has not noticed any chest pain or shortness of breath.    Current Outpatient Medications on File Prior to Visit  Medication Sig Dispense Refill  . acetaminophen (TYLENOL) 500 MG tablet Take 500-1,000 mg by mouth every 6 (six) hours as needed (for headaches).     Marland Kitchen aspirin EC 81 MG tablet Take 81 mg by mouth daily.    . Cholecalciferol (VITAMIN D3) 50 MCG (2000 UT) TABS Take 2,000 Units by mouth daily.     Marland Kitchen losartan (COZAAR) 100 MG  tablet Take 100 mg by mouth daily.    . metoprolol succinate (TOPROL XL) 25 MG 24 hr tablet Take 1 tablet (25 mg total) by mouth daily. 30 tablet 1  . Multiple Vitamins-Minerals (PRESERVISION AREDS 2) CAPS Take 1 capsule by mouth daily.    . nitroGLYCERIN (NITROSTAT) 0.4 MG SL tablet Place 1 tablet (0.4 mg total) under the tongue every 5 (five) minutes as needed for chest pain. 30 tablet 1  . pravastatin (PRAVACHOL) 40 MG tablet Take 40 mg by mouth daily.    . ticagrelor (BRILINTA) 90 MG TABS tablet Take 1 tablet (90 mg total) by mouth 2 (two) times daily. 60 tablet 1  . vitamin B-12 (CYANOCOBALAMIN) 1000 MCG tablet Take 1,000 mcg by mouth daily.    Marland Kitchen zolpidem (AMBIEN) 10 MG tablet Take 10 mg by mouth at bedtime as needed for sleep.     No current facility-administered medications on file prior to visit.    Cardiovascular & other pertient studies:  EKG 02/20/2019: Sinus rhythm 64 bpm. Old anterior infarct. Lateral T wave inversion, cannot exclude iscehmia.  Echocardiogram 02/11/2019: 1. Normal size LV. Moderate hypokinesis mid to distal anteroseptal wall.  LVEF 45-50%. Grade 1 diastolic dysfunction.  2. Normal RV size and function.  3. Mild mitral leaflet thickening.  4. Mild tricuspid regurgitation. Estimated PASP 32 mmHg.   Coronary angiography and intervention 02/10/2019: STEMI in evolution LM: Normal  LAD: Prox 100% occlusion. Mid 40% stenosis. Ramus: Mid 40% stenosis LCx: OM2 lateral branch with focal 40% stenosis. RCA: Minimal luminal irregularities.   Successful percutaneous coronary intervention pLAD Mechanical thrombectomy, PTCA and stent placement Resolute 3.5 X 18 mm Residual mild moderate disease best treated medically.    Recent labs: 02/11/2019: Glucose 134, BUN/Cr 13/0.93. EGFR >60. Na/K 140/3.5. Rest of the CMP normal H/H 12.5/49.8. MCV 91. Platelets 251 HbA1C 5.7% Chol 145, TG 128, HDL 28, LDL 91   Review of Systems  Cardiovascular: Negative for chest  pain, dyspnea on exertion, leg swelling, palpitations and syncope.  Respiratory: Positive for shortness of breath (As per HPI.).          Vitals:   02/20/19 1544 02/20/19 1557  BP: (!) 148/94 (!) 141/79  Pulse: 71 66  Temp: 97.7 F (36.5 C)   SpO2: 99%     Body mass index is 26.69 kg/m. Filed Weights   02/20/19 1544  Weight: 186 lb (84.4 kg)     Objective:   Physical Exam  Constitutional: He appears well-developed and well-nourished.  Neck: No JVD present.  Cardiovascular: Normal rate, regular rhythm, normal heart sounds and intact distal pulses.  No murmur heard. Pulmonary/Chest: Effort normal and breath sounds normal. He has no wheezes. He has no rales.  Musculoskeletal:        General: No edema.  Nursing note and vitals reviewed.         Assessment & Recommendations:   64 y.o.Caucasianmalewith hypertension, hyperlipidemia, CAD (STEMI 01/2019) treated with primary PCI to pLAD.   CAD: STEMI 01/2019 treated with primary PCI to proximal LAD. Residual moderate stenosis and mid LAD and OM2 that need further evaluation to reduce risk of urgent revascularization need. Will obtain Lexiscan nuclear stress test for ischemia evaluation. Continue aspirin, Brilinta at least till 01/2020. Switched pravastatin to Crestor.  While he has had intolerance to Crestor in the past, he would like to try this again before resorting to PCSK9 inhibitor. He has noticed sleepiness and fatigue, probably due to metoprolol succinate 25 mg daily.  Switched to carvedilol 6.25 mg twice daily. Refer to cardiac rehab. Will repeat lipid panel in 3 months. I congratulated the patient on quitting smoking. I suspect his dyspnea could be secondary to Brilinta.  Could consider using it with caffeine.  We will readdress in 4 weeks to monitor symptoms.  Hypertension: Mildly uncontrolled.  Hopefully will be controlled better with carvedilol.  Nigel Mormon, MD [facility].  [place] Pager: [phone] Office: [phone]

## 2019-02-22 ENCOUNTER — Other Ambulatory Visit: Payer: Self-pay

## 2019-02-22 ENCOUNTER — Ambulatory Visit: Payer: 59

## 2019-02-22 DIAGNOSIS — I2510 Atherosclerotic heart disease of native coronary artery without angina pectoris: Secondary | ICD-10-CM

## 2019-02-23 NOTE — Progress Notes (Signed)
Discussed results of stress test with patient.  Suggestion of completed infarct in mid to distal LAD territory without any residual ischemia.  No further cardiac catheterization necessary at this time.  Continue medical management.  Patient may resume work on 03/10/2019.  Follow-up with me on 03/27/2019.

## 2019-02-23 NOTE — Telephone Encounter (Signed)
Please read thank you

## 2019-02-23 NOTE — Telephone Encounter (Signed)
I discussed the results with the patient

## 2019-02-24 ENCOUNTER — Telehealth (HOSPITAL_COMMUNITY): Payer: Self-pay

## 2019-02-24 NOTE — Telephone Encounter (Signed)
Pt insurance is active and benefits verified through [employer], DED $3,000/$249 met, out of pocket $3,500/$31.73 met, co-insurance 20%. no pre-authorization required. Passport, 02/24/2019@9 :50am, REF# [id number]  Will contact patient to see if he is interested in the Cardiac Rehab Program. If interested, patient will need to complete follow up appt. Once completed, patient will be contacted for scheduling upon review by the RN Navigator.

## 2019-03-07 ENCOUNTER — Other Ambulatory Visit: Payer: Self-pay

## 2019-03-07 DIAGNOSIS — I2510 Atherosclerotic heart disease of native coronary artery without angina pectoris: Secondary | ICD-10-CM

## 2019-03-07 DIAGNOSIS — I10 Essential (primary) hypertension: Secondary | ICD-10-CM

## 2019-03-07 DIAGNOSIS — I2102 ST elevation (STEMI) myocardial infarction involving left anterior descending coronary artery: Secondary | ICD-10-CM

## 2019-03-07 MED ORDER — CARVEDILOL 6.25 MG PO TABS: 6.2500 mg | ORAL_TABLET | Freq: Two times a day (BID) | ORAL | 1 refills | Status: DC

## 2019-03-07 MED ORDER — TICAGRELOR 90 MG PO TABS: 90.0000 mg | ORAL_TABLET | Freq: Two times a day (BID) | ORAL | 1 refills | Status: DC

## 2019-03-07 MED ORDER — ROSUVASTATIN CALCIUM 10 MG PO TABS: 10.0000 mg | ORAL_TABLET | Freq: Every day | ORAL | 3 refills | Status: DC

## 2019-03-27 ENCOUNTER — Other Ambulatory Visit: Payer: Self-pay

## 2019-03-27 ENCOUNTER — Encounter: Payer: Self-pay | Admitting: Cardiology

## 2019-03-27 ENCOUNTER — Ambulatory Visit: Payer: 59 | Admitting: Cardiology

## 2019-03-27 VITALS — BP 124/85 | HR 58 | Ht 70.0 in | Wt 188.0 lb

## 2019-03-27 DIAGNOSIS — I2510 Atherosclerotic heart disease of native coronary artery without angina pectoris: Secondary | ICD-10-CM

## 2019-03-27 DIAGNOSIS — E782 Mixed hyperlipidemia: Secondary | ICD-10-CM

## 2019-03-27 DIAGNOSIS — I10 Essential (primary) hypertension: Secondary | ICD-10-CM

## 2019-03-27 NOTE — Progress Notes (Signed)
Follow up visit  Subjective:   [name], male    DOB: [birth year], 64 y.o.   MRN: [id number]   HPI  Chief Complaint  Patient presents with  . Coronary Artery Disease    4 week follow up    64 y.o.Caucasianmalewith hypertension, hyperlipidemia, CAD (STEMI 01/2019) treated with primary PCI to pLAD.   Patient had been having chest pain since 02/08/2019, which was dull, achy, worse with laying down, better with sitting up. Patient was chest pain free on arrival. His initial EKG's were suspicious for pericarditis. However, while in the ED, he had another episode of chest pain. EKG at 14:24 showed massive ST elevations in anterolateral as well as inferior leads. He was taken to cath lab. 100% prox LAD occlusion was found which was treated with aspiration thrombectomy, PTCA and stenting with Resolute 3.5X18 mm DES.  Patient did well rest of the hospital stay. Echo showed EF 45-50% with moderate mid to apical anteroseptal hypokinesis. Patient ambulated with cardiac rehab and was chest pain free on the day of discharge. Subsequent stress test showed anterolateral infarct, without ischemia.   Patient is doing well since his last visit. He denies chest pain, shortness of breath, palpitations, leg edema, orthopnea, PND, TIA/syncope. He has been walking up 40 steps at work, without any difficulty. He states that he is feeling better that he has before. He is tolerating all his medications well. He does not think he will be able to participate in cardiac rehab due to his work hours.     Current Outpatient Medications on File Prior to Visit  Medication Sig Dispense Refill  . acetaminophen (TYLENOL) 500 MG tablet Take 500-1,000 mg by mouth every 6 (six) hours as needed (for headaches).     Marland Kitchen aspirin EC 81 MG tablet Take 81 mg by mouth daily.    . carvedilol (COREG) 6.25 MG tablet Take 1 tablet (6.25 mg total) by mouth 2 (two) times daily. 180 tablet 1  . Cholecalciferol (VITAMIN D3) 50 MCG  (2000 UT) TABS Take 2,000 Units by mouth daily.     Marland Kitchen losartan (COZAAR) 100 MG tablet Take 100 mg by mouth daily.    . Multiple Vitamins-Minerals (PRESERVISION AREDS 2) CAPS Take 1 capsule by mouth daily.    . nitroGLYCERIN (NITROSTAT) 0.4 MG SL tablet Place 1 tablet (0.4 mg total) under the tongue every 5 (five) minutes as needed for chest pain. 30 tablet 1  . rosuvastatin (CRESTOR) 10 MG tablet Take 1 tablet (10 mg total) by mouth daily. 90 tablet 3  . ticagrelor (BRILINTA) 90 MG TABS tablet Take 1 tablet (90 mg total) by mouth 2 (two) times daily. 180 tablet 1  . vitamin B-12 (CYANOCOBALAMIN) 1000 MCG tablet Take 1,000 mcg by mouth daily.    Marland Kitchen zolpidem (AMBIEN) 10 MG tablet Take 10 mg by mouth at bedtime as needed for sleep.     No current facility-administered medications on file prior to visit.    Cardiovascular & other pertient studies:  Lexiscan (Walking with Jaci Carrel) Sestamibi Stress Test 02/22/2019: Nondiagnostic ECG stress. Mod Bruce protocol. No chest pain. Moderate sized mid to distal anterolateral and apical infarct without ischemia. Mild diaphragmatic attenuation artifact.  Gated SPECT imaging of the left ventricle demonstrating akinesis of the mid to distal anterior and antero apical wall.  Stress LV EF is moderately dysfunctional 32%.  Intermediate risk. No previous exam available for comparison.  EKG 02/20/2019: Sinus rhythm 64 bpm. Old anterior infarct. Lateral T  wave inversion, cannot exclude iscehmia.  Echocardiogram 02/11/2019: 1. Normal size LV. Moderate hypokinesis mid to distal anteroseptal wall.  LVEF 45-50%. Grade 1 diastolic dysfunction.  2. Normal RV size and function.  3. Mild mitral leaflet thickening.  4. Mild tricuspid regurgitation. Estimated PASP 32 mmHg.   Coronary angiography and intervention 02/10/2019: STEMI in evolution LM: Normal LAD: Prox 100% occlusion. Mid 40% stenosis. Ramus: Mid 40% stenosis LCx: OM2 lateral branch with focal 40%  stenosis. RCA: Minimal luminal irregularities.   Successful percutaneous coronary intervention pLAD Mechanical thrombectomy, PTCA and stent placement Resolute 3.5 X 18 mm Residual mild moderate disease best treated medically.    Recent labs: 02/11/2019: Glucose 134, BUN/Cr 13/0.93. EGFR >60. Na/K 140/3.5. Rest of the CMP normal H/H 12.5/49.8. MCV 91. Platelets 251 HbA1C 5.7% Chol 145, TG 128, HDL 28, LDL 91   Review of Systems  Cardiovascular: Negative for chest pain, dyspnea on exertion, leg swelling, palpitations and syncope.  Respiratory: Positive for shortness of breath (As per HPI.).          Vitals:   03/27/19 1518  BP: 124/85  Pulse: (!) 58  SpO2: 97%     Body mass index is 26.98 kg/m. Filed Weights   03/27/19 1518  Weight: 188 lb (85.3 kg)     Objective:   Physical Exam  Constitutional: He appears well-developed and well-nourished.  Neck: No JVD present.  Cardiovascular: Normal rate, regular rhythm, normal heart sounds and intact distal pulses.  No murmur heard. Pulmonary/Chest: Effort normal and breath sounds normal. He has no wheezes. He has no rales.  Musculoskeletal:        General: No edema.  Nursing note and vitals reviewed.         Assessment & Recommendations:   64 y.o.Caucasianmalewith hypertension, hyperlipidemia, CAD (STEMI 01/2019) treated with primary PCI to pLAD.   CAD: STEMI 01/2019 treated with primary PCI to proximal LAD. Residual moderate stenosis in OM2 and mid LAD, without any ischemia on stress test (02/2019). Old anterolateral infarct seen., Continue aspirin, Brilinta at least till 01/2020. Tolerating well. Continue Carvedilol 6.25 mg bid, crestor 10 mg. Has upcoming labs with PCP. If LDL >70, increase crestor to 20 mg daily.   Hypertension: Controlled.  F/u in July 2021.  Nigel Mormon, MD [facility] [place] Pager: [phone] Office: [phone]

## 2019-03-28 ENCOUNTER — Encounter (HOSPITAL_COMMUNITY): Payer: Self-pay

## 2019-03-28 ENCOUNTER — Telehealth (HOSPITAL_COMMUNITY): Payer: Self-pay

## 2019-03-28 NOTE — Telephone Encounter (Signed)
Attempted to call patient in regards to Cardiac Rehab - LM on VM Mailed letter 

## 2019-04-11 ENCOUNTER — Telehealth (HOSPITAL_COMMUNITY): Payer: Self-pay

## 2019-04-11 NOTE — Telephone Encounter (Signed)
No response from pt.  Closed referral  

## 2019-05-15 ENCOUNTER — Other Ambulatory Visit: Payer: Self-pay

## 2019-05-15 ENCOUNTER — Ambulatory Visit: Payer: 59 | Attending: Internal Medicine

## 2019-05-15 DIAGNOSIS — Z20822 Contact with and (suspected) exposure to covid-19: Secondary | ICD-10-CM

## 2019-05-16 ENCOUNTER — Telehealth: Payer: Self-pay | Admitting: *Deleted

## 2019-05-16 ENCOUNTER — Ambulatory Visit: Payer: 59 | Attending: Internal Medicine

## 2019-05-16 DIAGNOSIS — Z20822 Contact with and (suspected) exposure to covid-19: Secondary | ICD-10-CM

## 2019-05-16 LAB — SARS-COV-2, NAA 2 DAY TAT

## 2019-05-16 LAB — NOVEL CORONAVIRUS, NAA: SARS-CoV-2, NAA: NOT DETECTED

## 2019-05-16 NOTE — Telephone Encounter (Signed)
Patient notified that Covid-19 test results are still pending at this time. Understanding verbalized.

## 2019-05-17 LAB — SARS-COV-2, NAA 2 DAY TAT

## 2019-05-17 LAB — NOVEL CORONAVIRUS, NAA: SARS-CoV-2, NAA: NOT DETECTED

## 2019-05-23 ENCOUNTER — Other Ambulatory Visit: Payer: Self-pay | Admitting: Family Medicine

## 2019-05-23 DIAGNOSIS — K769 Liver disease, unspecified: Secondary | ICD-10-CM

## 2019-05-23 DIAGNOSIS — N281 Cyst of kidney, acquired: Secondary | ICD-10-CM

## 2019-07-04 ENCOUNTER — Other Ambulatory Visit: Payer: Self-pay | Admitting: Cardiology

## 2019-07-04 DIAGNOSIS — I2510 Atherosclerotic heart disease of native coronary artery without angina pectoris: Secondary | ICD-10-CM

## 2019-07-12 ENCOUNTER — Other Ambulatory Visit: Payer: Self-pay | Admitting: Cardiology

## 2019-07-12 DIAGNOSIS — I2510 Atherosclerotic heart disease of native coronary artery without angina pectoris: Secondary | ICD-10-CM

## 2019-07-12 DIAGNOSIS — I10 Essential (primary) hypertension: Secondary | ICD-10-CM

## 2019-07-27 ENCOUNTER — Ambulatory Visit: Payer: 59 | Admitting: Cardiology

## 2019-08-01 NOTE — Progress Notes (Signed)
Follow up visit  Subjective:   [name], male    DOB: [birth year], 64 y.o.   MRN: [id number]   HPI   Chief Complaint  Patient presents with  . Coronary Artery Disease  . Follow-up    64 y.o.Caucasianmalewith hypertension, hyperlipidemia, CAD (STEMI 01/2019) treated with primary PCI to pLAD.   Patient is doing well since his last visit. He denies chest pain, shortness of breath, palpitations, leg edema, orthopnea, PND, TIA/syncope. His only complaint is myalgias and arthralgias since being on Crestor.     Current Outpatient Medications on File Prior to Visit  Medication Sig Dispense Refill  . acetaminophen (TYLENOL) 500 MG tablet Take 500-1,000 mg by mouth every 6 (six) hours as needed (for headaches).     Marland Kitchen aspirin EC 81 MG tablet Take 81 mg by mouth daily.    Marland Kitchen BRILINTA 90 MG TABS tablet TAKE 1 TABLET BY MOUTH  TWICE DAILY 180 tablet 3  . carvedilol (COREG) 6.25 MG tablet TAKE 1 TABLET BY MOUTH  TWICE DAILY 180 tablet 3  . Cholecalciferol (VITAMIN D3) 50 MCG (2000 UT) TABS Take 2,000 Units by mouth daily.     Marland Kitchen losartan (COZAAR) 100 MG tablet Take 100 mg by mouth daily.    . Multiple Vitamins-Minerals (PRESERVISION AREDS 2) CAPS Take 1 capsule by mouth daily.    . nitroGLYCERIN (NITROSTAT) 0.4 MG SL tablet Place 1 tablet (0.4 mg total) under the tongue every 5 (five) minutes as needed for chest pain. 30 tablet 1  . rosuvastatin (CRESTOR) 10 MG tablet Take 1 tablet (10 mg total) by mouth daily. 90 tablet 3  . vitamin B-12 (CYANOCOBALAMIN) 1000 MCG tablet Take 1,000 mcg by mouth daily.    Marland Kitchen zolpidem (AMBIEN) 10 MG tablet Take 10 mg by mouth at bedtime as needed for sleep.     No current facility-administered medications on file prior to visit.    Cardiovascular & other pertient studies:  Lexiscan (Walking with Jaci Carrel) Sestamibi Stress Test 02/22/2019: Nondiagnostic ECG stress. Mod Bruce protocol. No chest pain. Moderate sized mid to distal anterolateral and apical  infarct without ischemia. Mild diaphragmatic attenuation artifact.  Gated SPECT imaging of the left ventricle demonstrating akinesis of the mid to distal anterior and antero apical wall.  Stress LV EF is moderately dysfunctional 32%.  Intermediate risk. No previous exam available for comparison.  EKG 02/20/2019: Sinus rhythm 64 bpm. Old anterior infarct. Lateral T wave inversion, cannot exclude iscehmia.  Echocardiogram 02/11/2019: 1. Normal size LV. Moderate hypokinesis mid to distal anteroseptal wall.  LVEF 45-50%. Grade 1 diastolic dysfunction.  2. Normal RV size and function.  3. Mild mitral leaflet thickening.  4. Mild tricuspid regurgitation. Estimated PASP 32 mmHg.   Coronary angiography and intervention 02/10/2019: STEMI in evolution LM: Normal LAD: Prox 100% occlusion. Mid 40% stenosis. Ramus: Mid 40% stenosis LCx: OM2 lateral branch with focal 40% stenosis. RCA: Minimal luminal irregularities.   Successful percutaneous coronary intervention pLAD Mechanical thrombectomy, PTCA and stent placement Resolute 3.5 X 18 mm Residual mild moderate disease best treated medically.    Recent labs: 04/07/2019: Glucose 109. BUN/Cr 21/1.0 Chol 103, TG 67, HDL 36, LDL 52  02/11/2019: Glucose 134, BUN/Cr 13/0.93. EGFR >60. Na/K 140/3.5. Rest of the CMP normal H/H 12.5/49.8. MCV 91. Platelets 251 HbA1C 5.7% Chol 145, TG 128, HDL 28, LDL 91   Review of Systems  Cardiovascular: Negative for chest pain, dyspnea on exertion, leg swelling, palpitations and syncope.  Respiratory: Positive for  shortness of breath (As per HPI.).          Vitals:   08/02/19 0830  BP: (!) 147/77  Pulse: (!) 54  Resp: 17  SpO2: 96%     Body mass index is 26.98 kg/m. Filed Weights   08/02/19 0830  Weight: 188 lb (85.3 kg)     Objective:   Physical Exam Vitals and nursing note reviewed.  Constitutional:      Appearance: He is well-developed.  Neck:     Vascular: No JVD.   Cardiovascular:     Rate and Rhythm: Normal rate and regular rhythm.     Pulses: Intact distal pulses.     Heart sounds: Normal heart sounds. No murmur heard.   Pulmonary:     Effort: Pulmonary effort is normal.     Breath sounds: Normal breath sounds. No wheezing or rales.           Assessment & Recommendations:   64 y.o.Caucasianmalewith hypertension, hyperlipidemia, CAD (STEMI 01/2019) treated with primary PCI to pLAD.   CAD: STEMI 01/2019 treated with primary PCI to proximal LAD. Residual moderate stenosis in OM2 and mid LAD, without any ischemia on stress test (02/2019). Old anterolateral infarct seen., Continue aspirin, Brilinta at least till 01/2020. Tolerating well. Continue Carvedilol 6.25 mg bid LDL down to 52 (03/2019) on Crestor, but has had myalgias. Switch to lipitor 40 mg.  Hypertension: Elevated today, but usually 120/80s at home. Continue home monitoring. No change made today.   F/u in 6 months  Maanya Hippert Esther Hardy, MD [facility] [place] Pager: [phone] Office: [phone]

## 2019-08-02 ENCOUNTER — Ambulatory Visit: Payer: 59 | Admitting: Cardiology

## 2019-08-02 ENCOUNTER — Encounter: Payer: Self-pay | Admitting: Cardiology

## 2019-08-02 ENCOUNTER — Other Ambulatory Visit: Payer: Self-pay

## 2019-08-02 VITALS — BP 147/77 | HR 54 | Resp 17 | Ht 70.0 in | Wt 188.0 lb

## 2019-08-02 DIAGNOSIS — I10 Essential (primary) hypertension: Secondary | ICD-10-CM

## 2019-08-02 DIAGNOSIS — E782 Mixed hyperlipidemia: Secondary | ICD-10-CM

## 2019-08-02 DIAGNOSIS — I2510 Atherosclerotic heart disease of native coronary artery without angina pectoris: Secondary | ICD-10-CM

## 2019-08-02 MED ORDER — TICAGRELOR 90 MG PO TABS: 90.0000 mg | ORAL_TABLET | Freq: Two times a day (BID) | ORAL | 1 refills | Status: DC

## 2019-08-02 MED ORDER — ATORVASTATIN CALCIUM 40 MG PO TABS: 40.0000 mg | ORAL_TABLET | Freq: Every day | ORAL | 2 refills | Status: DC

## 2019-08-02 MED ORDER — CARVEDILOL 6.25 MG PO TABS: 6.2500 mg | ORAL_TABLET | Freq: Two times a day (BID) | ORAL | 3 refills | Status: DC

## 2019-09-26 ENCOUNTER — Other Ambulatory Visit: Payer: Self-pay

## 2019-09-26 ENCOUNTER — Ambulatory Visit
Admission: RE | Admit: 2019-09-26 | Discharge: 2019-09-26 | Disposition: A | Discharge status: Home / Self Care | Payer: 59 | Source: Ambulatory Visit | Attending: Family Medicine | Admitting: Family Medicine

## 2019-09-26 DIAGNOSIS — N281 Cyst of kidney, acquired: Secondary | ICD-10-CM

## 2019-09-26 DIAGNOSIS — K769 Liver disease, unspecified: Secondary | ICD-10-CM

## 2019-09-26 MED ORDER — GADOBENATE DIMEGLUMINE 529 MG/ML IV SOLN
17.0000 mL | Freq: Once | INTRAVENOUS | Status: AC | PRN
  Administered 2019-09-26: 17 mL via INTRAVENOUS

## 2019-10-10 ENCOUNTER — Telehealth: Payer: Self-pay

## 2019-10-12 ENCOUNTER — Other Ambulatory Visit: Payer: Self-pay

## 2019-10-12 MED ORDER — REPATHA SURECLICK 140 MG/ML ~~LOC~~ SOAJ: 1.0000 mL | SUBCUTANEOUS | 3 refills | Status: DC

## 2019-10-12 NOTE — Telephone Encounter (Signed)
If unable to tolerate lipitor or crestor, okay to switch to pravastatin and add Repatha. Can add 140 mg twice weekly. Please send prescription and offer in office administration, if patient is interested.   Thanks MJP

## 2019-10-17 ENCOUNTER — Other Ambulatory Visit: Payer: Self-pay

## 2019-10-19 ENCOUNTER — Telehealth: Payer: Self-pay

## 2019-10-19 NOTE — Telephone Encounter (Signed)
Patient insurance denied Repatha, I faxed clinicals to [employer] [facility]

## 2019-11-15 ENCOUNTER — Other Ambulatory Visit: Payer: Self-pay

## 2019-11-15 MED ORDER — REPATHA SURECLICK 140 MG/ML ~~LOC~~ SOAJ: 1.0000 mL | SUBCUTANEOUS | 3 refills | Status: DC

## 2019-12-21 ENCOUNTER — Other Ambulatory Visit: Payer: Self-pay | Admitting: Cardiology

## 2019-12-21 DIAGNOSIS — I2510 Atherosclerotic heart disease of native coronary artery without angina pectoris: Secondary | ICD-10-CM

## 2020-02-02 ENCOUNTER — Ambulatory Visit: Payer: 59 | Admitting: Cardiology

## 2020-02-12 ENCOUNTER — Other Ambulatory Visit: Payer: Self-pay

## 2020-02-12 ENCOUNTER — Ambulatory Visit: Payer: 59 | Admitting: Cardiology

## 2020-02-12 ENCOUNTER — Encounter: Payer: Self-pay | Admitting: Cardiology

## 2020-02-12 VITALS — BP 158/87 | HR 72 | Temp 98.0°F | Resp 16 | Ht 70.0 in | Wt 195.0 lb

## 2020-02-12 DIAGNOSIS — I10 Essential (primary) hypertension: Secondary | ICD-10-CM

## 2020-02-12 DIAGNOSIS — E782 Mixed hyperlipidemia: Secondary | ICD-10-CM

## 2020-02-12 DIAGNOSIS — I2510 Atherosclerotic heart disease of native coronary artery without angina pectoris: Secondary | ICD-10-CM

## 2020-02-12 MED ORDER — NITROGLYCERIN 0.4 MG SL SUBL: 0.4000 mg | SUBLINGUAL_TABLET | SUBLINGUAL | 1 refills | Status: DC | PRN

## 2020-02-12 MED ORDER — CARVEDILOL 6.25 MG PO TABS: 6.2500 mg | ORAL_TABLET | Freq: Two times a day (BID) | ORAL | 3 refills | Status: DC

## 2020-02-12 NOTE — Progress Notes (Signed)
Follow up visit  Subjective:   [name], male    DOB: [birth year], 65 y.o.   MRN: [id number]   HPI   Chief Complaint  Patient presents with  . Coronary Artery Disease  . Follow-up    6 month    65 y.o.Caucasianmalewith hypertension, hyperlipidemia, CAD (STEMI 01/2019) treated with primary PCI to pLAD.   Patient is doing well, denies chest pain, shortness of breath, palpitations, leg edema, orthopnea, PND, TIA/syncope. In 10/2019, he was switched to pravastatin by his PCP due to myalgias. He has not tolerated Crestor and :Lipitor.    Current Outpatient Medications on File Prior to Visit  Medication Sig Dispense Refill  . acetaminophen (TYLENOL) 500 MG tablet Take 500-1,000 mg by mouth every 6 (six) hours as needed (for headaches).    Marland Kitchen aspirin EC 81 MG tablet Take 81 mg by mouth daily.    Marland Kitchen BRILINTA 90 MG TABS tablet TAKE 1 TABLET BY MOUTH  TWICE DAILY 180 tablet 3  . carvedilol (COREG) 6.25 MG tablet Take 1 tablet (6.25 mg total) by mouth 2 (two) times daily. 180 tablet 3  . Cholecalciferol (VITAMIN D3) 50 MCG (2000 UT) TABS Take 2,000 Units by mouth daily.     Marland Kitchen losartan (COZAAR) 100 MG tablet Take 100 mg by mouth daily.    . Multiple Vitamins-Minerals (PRESERVISION AREDS 2) CAPS Take 1 capsule by mouth daily.    . nitroGLYCERIN (NITROSTAT) 0.4 MG SL tablet Place 1 tablet (0.4 mg total) under the tongue every 5 (five) minutes as needed for chest pain. 30 tablet 1  . pravastatin (PRAVACHOL) 40 MG tablet Take 40 mg by mouth daily.    . vitamin B-12 (CYANOCOBALAMIN) 1000 MCG tablet Take 1,000 mcg by mouth daily.    Marland Kitchen zolpidem (AMBIEN) 10 MG tablet Take 10 mg by mouth at bedtime as needed for sleep.     No current facility-administered medications on file prior to visit.    Cardiovascular & other pertient studies:  EKG 02/12/2020: Sinus rhythm 62 bpm Old anteroseptal infarct   Lexiscan (Walking with mod Bruce) Sestamibi Stress Test 02/22/2019: Nondiagnostic ECG  stress. Mod Bruce protocol. No chest pain. Moderate sized mid to distal anterolateral and apical infarct without ischemia. Mild diaphragmatic attenuation artifact.  Gated SPECT imaging of the left ventricle demonstrating akinesis of the mid to distal anterior and antero apical wall.  Stress LV EF is moderately dysfunctional 32%.  Intermediate risk. No previous exam available for comparison.  Echocardiogram 02/11/2019: 1. Normal size LV. Moderate hypokinesis mid to distal anteroseptal wall.  LVEF 45-50%. Grade 1 diastolic dysfunction.  2. Normal RV size and function.  3. Mild mitral leaflet thickening.  4. Mild tricuspid regurgitation. Estimated PASP 32 mmHg.   Coronary angiography and intervention 02/10/2019: STEMI in evolution LM: Normal LAD: Prox 100% occlusion. Mid 40% stenosis. Ramus: Mid 40% stenosis LCx: OM2 lateral branch with focal 40% stenosis. RCA: Minimal luminal irregularities.   Successful percutaneous coronary intervention pLAD Mechanical thrombectomy, PTCA and stent placement Resolute 3.5 X 18 mm Residual mild moderate disease best treated medically.    Recent labs: 10/10/2019: Glucose 92, BUN/Cr 18/0.87. EGFR 60. HbA1C 5.7% Chol 113, TG 66, HDL 37, LDL 62 TSH 1.4 normal  04/07/2019: Glucose 109. BUN/Cr 21/1.0 Chol 103, TG 67, HDL 36, LDL 52  02/11/2019: Glucose 134, BUN/Cr 13/0.93. EGFR >60. Na/K 140/3.5. Rest of the CMP normal H/H 12.5/49.8. MCV 91. Platelets 251 HbA1C 5.7% Chol 145, TG 128, HDL 28, LDL 91  Review of Systems  Cardiovascular: Negative for chest pain, dyspnea on exertion, leg swelling, palpitations and syncope.  Respiratory: Positive for shortness of breath (As per HPI.).         Vitals:   02/12/20 1435  BP: (!) 158/87  Pulse: 72  Resp: 16  Temp: 98 F (36.7 C)  SpO2: 93%     Body mass index is 27.98 kg/m. Filed Weights   02/12/20 1435  Weight: 195 lb (88.5 kg)     Objective:   Physical Exam Vitals and nursing  note reviewed.  Constitutional:      Appearance: He is well-developed.  Neck:     Vascular: No JVD.  Cardiovascular:     Rate and Rhythm: Normal rate and regular rhythm.     Pulses: Intact distal pulses.     Heart sounds: Normal heart sounds. No murmur heard.   Pulmonary:     Effort: Pulmonary effort is normal.     Breath sounds: Normal breath sounds. No wheezing or rales.           Assessment & Recommendations:   65 y.o.Caucasianmalewith hypertension, hyperlipidemia, CAD (STEMI 01/2019) treated with primary PCI to pLAD.   CAD without angina: STEMI 01/2019 treated with primary PCI to proximal LAD. Residual moderate stenosis in OM2 and mid LAD, without any ischemia on stress test (02/2019). Old anterolateral infarct seen., Continue aspirin, stop Brilinta. Continue Carvedilol 6.25 mg bid. BP noticed to be elevated. If remains elevated See below re: lipid management  Hypertension: BP elevated today. Has been normal at home. No change made today. Recommend monitoring BP at home. He will keep me posted. Has f/u w/PCP in March.  Mixed hyperlipidemia: Currently on pravastatin. Check lipid panel Reportedly Repatha was not approved before. Will try again, if LDL is elevated now on pravastatin. If Repatha does not get approved, will try Praluent  Nigel Mormon, MD [facility] [place] Pager: [phone] Office: [phone]

## 2020-02-13 ENCOUNTER — Encounter: Payer: Self-pay | Admitting: Cardiology

## 2020-02-14 LAB — LIPID PANEL
Chol/HDL Ratio: 3.2 ratio (ref 0.0–5.0)
Cholesterol, Total: 114 mg/dL (ref 100–199)
HDL: 36 mg/dL — ABNORMAL LOW (ref >39)
LDL Chol Calc (NIH): 63 mg/dL (ref 0–99)
Triglycerides: 73 mg/dL (ref 0–149)
VLDL Cholesterol Cal: 15 mg/dL (ref 5–40)

## 2020-02-14 NOTE — Addendum Note (Signed)
Addended by: Glenetta Kiger J on: 02/14/2020 08:23 AM   Modules accepted: Orders  

## 2020-02-14 NOTE — Addendum Note (Signed)
Addended by: Nigel Mormon on: 02/14/2020 08:23 AM   Modules accepted: Orders

## 2020-05-10 ENCOUNTER — Ambulatory Visit: Payer: Self-pay | Admitting: General Surgery

## 2020-05-10 NOTE — H&P (Signed)
History of Present Illness The patient is a 65 year old male who presents with an inguinal hernia. Referred by: Dr. Moreen Fowler Chief Complaint: Left inguinal hernia  Patient is a 65 year old male, with a 5+ year history of a left inguinal hernia. Patient states that recently he was lifting something from the ground and had some pain to the left inguinal area. He states that he has noticed a bulge in that area prior to discontinuing noticed a bulge. He does state that the bulge does flatten out when he lays down. He's had no signs or symptoms of incarceration or strangulation. . Patient also has a history of a L4-5 HNP and repair.  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  Patient had a previous open appendectomy as a child    Past Surgical History  Appendectomy  Colon Polyp Removal - Colonoscopy  Spinal Surgery - Lower Back  Tonsillectomy   Diagnostic Studies History Colonoscopy  1-5 years ago  Allergies No Known Drug Allergies  [05/10/2020]: Allergies Reconciled   Medication History Carvedilol (6.25MG  Tablet, Oral) Active. Pravastatin Sodium (40MG  Tablet, Oral) Active. Olmesartan Medoxomil (5MG  Tablet, Oral) Active. Aspirin (81MG  Tablet Chewable, Oral) Active. B12 Folate (800-800MCG Capsule, Oral) Active. D3 Vitamin (10MCG/ML Liquid, Oral) Active. Viteyes AREDS Advanced (Oral) Active. Sildenafil Citrate (10MG /12.5ML Solution, Intravenous) Active. Medications Reconciled  Social History Alcohol use  Occasional alcohol use. Caffeine use  Carbonated beverages, Tea. No drug use  Tobacco use  Former smoker.  Family History Breast Cancer  Sister. Cancer  Sister. Depression  Daughter. Heart Disease  Father. Hypertension  Brother, Father, Mother. Thyroid problems  Daughter.  Other Problems High blood pressure  Hypercholesterolemia  Inguinal Hernia  Kidney Stone  Melanoma  Myocardial infarction      Review of Systems General Not Present- Appetite Loss, Chills, Fatigue, Fever, Night Sweats, Weight Gain and Weight Loss. Skin Not Present- Change in Wart/Mole, Dryness, Hives, Jaundice, New Lesions, Non-Healing Wounds, Rash and Ulcer. HEENT Present- Wears glasses/contact lenses. Not Present- Earache, Hearing Loss, Hoarseness, Nose Bleed, Oral Ulcers, Ringing in the Ears, Seasonal Allergies, Sinus Pain, Sore Throat, Visual Disturbances and Yellow Eyes. Respiratory Not Present- Bloody sputum, Chronic Cough, Difficulty Breathing, Snoring and Wheezing. Breast Not Present- Breast Mass, Breast Pain, Nipple Discharge and Skin Changes. Cardiovascular Not Present- Chest Pain, Difficulty Breathing Lying Down, Leg Cramps, Palpitations, Rapid Heart Rate, Shortness of Breath and Swelling of Extremities. Gastrointestinal Not Present- Abdominal Pain, Bloating, Bloody Stool, Change in Bowel Habits, Chronic diarrhea, Constipation, Difficulty Swallowing, Excessive gas, Gets full quickly at meals, Hemorrhoids, Indigestion, Nausea, Rectal Pain and Vomiting. Male Genitourinary Not Present- Blood in Urine, Change in Urinary Stream, Frequency, Impotence, Nocturia, Painful Urination, Urgency and Urine Leakage. Musculoskeletal Not Present- Back Pain, Joint Pain, Joint Stiffness, Muscle Pain, Muscle Weakness and Swelling of Extremities. Neurological Not Present- Decreased Memory, Fainting, Headaches, Numbness, Seizures, Tingling, Tremor, Trouble walking and Weakness. Psychiatric Not Present- Anxiety, Bipolar, Change in Sleep Pattern, Depression, Fearful and Frequent crying. Endocrine Not Present- Cold Intolerance, Excessive Hunger, Hair Changes, Heat Intolerance, Hot flashes and New Diabetes. Hematology Not Present- Blood Thinners, Easy Bruising, Excessive bleeding, Gland problems, HIV and Persistent Infections. All other systems negative  Vitals 05/10/2020 9:32 AM Weight: 192.25 lb Height: 66in Body Surface  Area: 1.97 m Body Mass Index: 31.03 kg/m  Temp.: 97.15F  Pulse: 63 (Regular)  P.OX: 97% (Room air) BP: 140/90(Sitting, Left Arm, Standard)       Physical Exam The physical exam findings are as  follows: Note: Constitutional: No acute distress, conversant, appears stated age  Eyes: Anicteric sclerae, moist conjunctiva, no lid lag  Neck: No thyromegaly, trachea midline, no cervical lymphadenopathy  Lungs: Clear to auscultation biilaterally, normal respiratory effot  Cardiovascular: regular rate & rhythm, no murmurs, no peripheal edema, pedal pulses 2+  GI: Soft, no masses or hepatosplenomegaly, non-tender to palpation  MSK: Normal gait, no clubbing cyanosis, edema  Skin: No rashes, palpation reveals normal skin turgor  Psychiatric: Appropriate judgment and insight, oriented to person, place, and time  Abdomen Inspection Hernias - Inguinal hernia - Bilateral - Reducible - Bilateral (L>R) .    Assessment & Plan  BILATERAL INGUINAL HERNIA WITHOUT OBSTRUCTION OR GANGRENE, RECURRENCE NOT SPECIFIED (K40.20) Impression: 65 year old male with a left greater than right inguinal hernia.  We'll get cardiac clearance by Dr. Gailen Shelter. 1. The patient will like to proceed to the operating room for laparoscopic bilateral inguinal hernia repair with mesh.  2. I discussed with the patient the signs and symptoms of incarceration and strangulation and the need to proceed to the ER should they occur.  3. I discussed with the patient the risks and benefits of the procedure to include but not limited to: Infection, bleeding, damage to surrounding structures, possible need for further surgery, possible nerve pain, and possible recurrence. The patient was understanding and wishes to proceed.

## 2020-07-01 ENCOUNTER — Telehealth: Payer: Self-pay

## 2020-07-01 ENCOUNTER — Inpatient Hospital Stay (HOSPITAL_COMMUNITY): Admission: RE | Admit: 2020-07-01 | Payer: 59 | Source: Ambulatory Visit

## 2020-07-01 NOTE — Telephone Encounter (Signed)
Called and spoke to pt regarding surgery. Pt will leave it up to the surgeons.

## 2020-07-01 NOTE — Telephone Encounter (Signed)
He will need to check with the surgeons re: their policy.

## 2020-07-01 NOTE — Telephone Encounter (Signed)
Pt has an upcoming appointment for a hernia surgery. His last appointment he got approved for the surgery, but last week he tested positive for covid on the 14th. Pt would like to know if he should delay his surgery or go ahead and have it. June 29th is his scheduled surgery date.

## 2020-07-03 NOTE — Pre-Procedure Instructions (Addendum)
Surgical Instructions    Your procedure is scheduled on Wednesday June 29th.  Report to [facility] Main Entrance "A" at 08:30 A.M., then check in with the Admitting office.  Call this number if you have problems the morning of surgery:  [phone]   If you have any questions prior to your surgery date call [phone]: Open Monday-Friday 8am-4pm    Remember:  Do not eat after midnight the night before your surgery  You may drink clear liquids until 07:30 a.m. the morning of your surgery.   Clear liquids allowed are: Water, Non-Citrus Juices (without pulp), Carbonated Beverages, Clear Tea, Black Coffee Only, and Gatorade    Take these medicines the morning of surgery with A SIP OF WATER   acetaminophen (TYLENOL)- If needed  carvedilol (COREG)  pravastatin (PRAVACHOL)  nitroGLYCERIN (NITROSTAT)- If needed Patient Instructions  The night before surgery:  No food after midnight. ONLY clear liquids after midnight  The day of surgery (if you do NOT have diabetes):  Drink ONE (1) Pre-Surgery Clear Ensure by 07:30 a.m. the morning of surgery. Drink in one sitting. Do not sip.  This drink was given to you during your hospital  pre-op appointment visit.  Nothing else to drink after completing the  Pre-Surgery Clear Ensure.         If you have questions, please contact your surgeon's office.    As of today, STOP taking any Aspirin (unless otherwise instructed by your surgeon) Aleve, Naproxen, Ibuprofen, Motrin, Advil, Goody's, BC's, all herbal medications, fish oil, and all vitamins.                     Do NOT Smoke (Tobacco/Vaping) or drink Alcohol 24 hours prior to your procedure.  If you use a CPAP at night, you may bring all equipment for your overnight stay.   Contacts, glasses, piercing's, hearing aid's, dentures or partials may not be worn into surgery, please bring cases for these belongings.    For patients admitted to the hospital, discharge time will be determined  by your treatment team.   Patients discharged the day of surgery will not be allowed to drive home, and someone needs to stay with them for 24 hours.  ONLY 1 SUPPORT PERSON MAY BE PRESENT WHILE YOU ARE IN SURGERY. IF YOU ARE TO BE ADMITTED ONCE YOU ARE IN YOUR ROOM YOU WILL BE ALLOWED TWO (2) VISITORS.  Minor children may have two parents present. Special consideration for safety and communication needs will be reviewed on a case by case basis.   Special instructions:   [facility]- Preparing For Surgery  Before surgery, you can play an important role. Because skin is not sterile, your skin needs to be as free of germs as possible. You can reduce the number of germs on your skin by washing with CHG (chlorahexidine gluconate) Soap before surgery.  CHG is an antiseptic cleaner which kills germs and bonds with the skin to continue killing germs even after washing.    Oral Hygiene is also important to reduce your risk of infection.  Remember - BRUSH YOUR TEETH THE MORNING OF SURGERY WITH YOUR REGULAR TOOTHPASTE  Please do not use if you have an allergy to CHG or antibacterial soaps. If your skin becomes reddened/irritated stop using the CHG.  Do not shave (including legs and underarms) for at least 48 hours prior to first CHG shower. It is OK to shave your face.  Please follow these instructions carefully.   Shower  the NIGHT BEFORE SURGERY and the MORNING OF SURGERY  If you chose to wash your hair, wash your hair first as usual with your normal shampoo.  After you shampoo, rinse your hair and body thoroughly to remove the shampoo.  Use CHG Soap as you would any other liquid soap. You can apply CHG directly to the skin and wash gently with a scrungie or a clean washcloth.   Apply the CHG Soap to your body ONLY FROM THE NECK DOWN.  Do not use on open wounds or open sores. Avoid contact with your eyes, ears, mouth and genitals (private parts). Wash Face and genitals (private parts)  with your  normal soap.   Wash thoroughly, paying special attention to the area where your surgery will be performed.  Thoroughly rinse your body with warm water from the neck down.  DO NOT shower/wash with your normal soap after using and rinsing off the CHG Soap.  Pat yourself dry with a CLEAN TOWEL.  Wear CLEAN PAJAMAS to bed the night before surgery  Place CLEAN SHEETS on your bed the night before your surgery  DO NOT SLEEP WITH PETS.   Day of Surgery: Shower with CHG soap. Do not wear jewelry, make up, nail polish, gel polish, artificial nails, or any other type of covering on natural nails including finger and toenails. If patients have artificial nails, gel coating, etc. that need to be removed by a nail salon please have this removed prior to surgery. Surgery may need to be canceled/delayed if the surgeon/ anesthesia feels like the patient is unable to be adequately monitored. Do not wear lotions, powders, perfumes/colognes, or deodorant. Do not shave 48 hours prior to surgery.  Men may shave face and neck. Do not bring valuables to the hospital. [facility] is not responsible for any belongings or valuables. Wear Clean/Comfortable clothing the morning of surgery Remember to brush your teeth WITH YOUR REGULAR TOOTHPASTE.   Please read over the following fact sheets that you were given.

## 2020-07-04 ENCOUNTER — Encounter (HOSPITAL_COMMUNITY)
Admission: RE | Admit: 2020-07-04 | Discharge: 2020-07-04 | Disposition: A | Discharge status: Home / Self Care | Payer: 59 | Source: Ambulatory Visit | Attending: General Surgery | Admitting: General Surgery

## 2020-07-04 ENCOUNTER — Encounter (HOSPITAL_COMMUNITY): Payer: Self-pay

## 2020-07-04 ENCOUNTER — Other Ambulatory Visit: Payer: Self-pay

## 2020-07-04 DIAGNOSIS — Z01812 Encounter for preprocedural laboratory examination: Secondary | ICD-10-CM | POA: Insufficient documentation

## 2020-07-04 HISTORY — DX: Personal history of urinary calculi: Z87.442

## 2020-07-04 HISTORY — DX: Acute myocardial infarction, unspecified: I21.9

## 2020-07-04 HISTORY — DX: Malignant (primary) neoplasm, unspecified: C80.1

## 2020-07-04 LAB — BASIC METABOLIC PANEL
Anion gap: 6 (ref 5–15)
BUN: 26 mg/dL — ABNORMAL HIGH (ref 8–23)
CO2: 26 mmol/L (ref 22–32)
Calcium: 9.1 mg/dL (ref 8.9–10.3)
Chloride: 107 mmol/L (ref 98–111)
Creatinine, Ser: 1.07 mg/dL (ref 0.61–1.24)
GFR, Estimated: >60 mL/min (ref >60)
Glucose, Bld: 98 mg/dL (ref 70–99)
Potassium: 4 mmol/L (ref 3.5–5.1)
Sodium: 139 mmol/L (ref 135–145)

## 2020-07-04 LAB — CBC
HCT: 43.6 % (ref 39.0–52.0)
Hemoglobin: 14.2 g/dL (ref 13.0–17.0)
MCH: 30.8 pg (ref 26.0–34.0)
MCHC: 32.6 g/dL (ref 30.0–36.0)
MCV: 94.6 fL (ref 80.0–100.0)
Platelets: 299 10*3/uL (ref 150–400)
RBC: 4.61 MIL/uL (ref 4.22–5.81)
RDW: 12.8 % (ref 11.5–15.5)
WBC: 12.3 10*3/uL — ABNORMAL HIGH (ref 4.0–10.5)
nRBC: 0 % (ref 0.0–0.2)

## 2020-07-04 NOTE — Progress Notes (Signed)
PCP - Dr. Antony Contras Cardiologist - Dr. Vernell Leep  PPM/ICD - n/a Device Orders - n/a Rep Notified - n/a  Chest x-ray - 02/10/19 EKG - 02/12/20 Stress Test - 02/22/19 ECHO - 02/11/19 Cardiac Cath - 02/10/19  Sleep Study - denies CPAP - n/a  Fasting Blood Sugar - n/a   Blood Thinner Instructions: n/a Aspirin Instructions: Patient states he was instructed to stop taking Aspirin on 07/04/20 per his surgeon.   ERAS Protcol - Yes PRE-SURGERY Ensure or G2- Ensure  COVID TEST- Patient states he had a positive home test on 06/25/20 and Dr. Rosendo Gros is aware and states he is ok to proceed with surgery. Patient states he made his PCP aware and was prescribed Paxlovid which he has completed. Patient is scheduled for ambulatory surgery.    Anesthesia review: Yes. Cardiac history  Patient denies shortness of breath, fever, cough and chest pain at PAT appointment   All instructions explained to the patient, with a verbal understanding of the material. Patient agrees to go over the instructions while at home for a better understanding. Patient also instructed to self quarantine after being tested for COVID-19. The opportunity to ask questions was provided.

## 2020-07-05 NOTE — Anesthesia Preprocedure Evaluation (Addendum)
Anesthesia Evaluation  Patient identified by MRN, date of birth, ID band Patient awake    Reviewed: Allergy & Precautions, NPO status , Patient's Chart, lab work & pertinent test results, reviewed documented beta blocker date and time   Airway Mallampati: II  TM Distance: >3 FB Neck ROM: Full    Dental no notable dental hx. (+) Teeth Intact, Dental Advisory Given   Pulmonary neg pulmonary ROS, former smoker,    Pulmonary exam normal breath sounds clear to auscultation       Cardiovascular hypertension, Pt. on home beta blockers and Pt. on medications + CAD (STEMI 01/2019) treated with primary PCI to pLAD), + Past MI and + Cardiac Stents  Normal cardiovascular exam Rhythm:Regular Rate:Normal  EKG 02/12/2020: Sinus rhythm 62 bpm Old anteroseptal infarct  Lexiscan (Walking with mod Bruce) Sestamibi Stress Test02/10/2019: Nondiagnostic ECG stress. Mod Bruce protocol. No chest pain. Moderate sized mid to distal anterolateral and apical infarct without ischemia.Mild diaphragmatic attenuation artifact. Gated SPECT imaging of the left ventricle demonstrating akinesis of the mid to distal anterior and antero apical wall. Stress LV EF is moderately dysfunctional 32%. Intermediate risk. No previous exam available for comparison.  Echocardiogram 02/11/2019: 1. Normal size LV. Moderate hypokinesis mid to distal anteroseptal wall.  LVEF 45-50%. Grade 1 diastolic dysfunction.  2. Normal RV size and function.  3. Mild mitral leaflet thickening.  4. Mild tricuspid regurgitation. Estimated PASP 32 mmHg.  Coronary angiography and intervention 02/10/2019: STEMI in evolution LM: Normal LAD: Prox 100% occlusion. Mid 40% stenosis. Ramus: Mid 40% stenosis LCx: OM2 lateral branch with focal 40% stenosis. RCA: Minimal luminal irregularities.  Successful percutaneous coronary intervention pLAD Mechanical thrombectomy, PTCA and stent  placement Resolute 3.5 X 18 mm Residual mild moderate disease best treated medically   Neuro/Psych negative neurological ROS  negative psych ROS   GI/Hepatic negative GI ROS, Neg liver ROS,   Endo/Other  negative endocrine ROS  Renal/GU negative Renal ROS  negative genitourinary   Musculoskeletal negative musculoskeletal ROS (+)   Abdominal   Peds  Hematology negative hematology ROS (+)   Anesthesia Other Findings   Reproductive/Obstetrics                           Anesthesia Physical Anesthesia Plan  ASA: 3  Anesthesia Plan: General   Post-op Pain Management:    Induction: Intravenous  PONV Risk Score and Plan: 2 and Midazolam, Dexamethasone and Ondansetron  Airway Management Planned: Oral ETT  Additional Equipment:   Intra-op Plan:   Post-operative Plan: Extubation in OR  Informed Consent: I have reviewed the patients History and Physical, chart, labs and discussed the procedure including the risks, benefits and alternatives for the proposed anesthesia with the patient or authorized representative who has indicated his/her understanding and acceptance.     Dental advisory given  Plan Discussed with: CRNA  Anesthesia Plan Comments: (.  )       Anesthesia Quick Evaluation

## 2020-07-05 NOTE — Progress Notes (Signed)
Anesthesia Chart Review:  Follows with cardiologist Dr. Virgina Jock for history of HTN, HLD, CAD (STEMI 01/2019) treated with primary PCI to pLAD.  Residual moderate stenosis in OM 2 and mid LAD, without ischemia on stress test 02/2019.  Last seen by Dr. Virgina Jock 02/12/2020.  Per note, no chest pain, shortness of breath, palpitations.  Brilinta was stopped.  Advised to continue aspirin.  Preop clearance dated 05/22/2020 states patient is intermediate risk for perioperative CV complication and states aspirin can be held 5 days before surgery.  Preop labs reviewed, unremarkable.  EKG 02/12/2020: Sinus rhythm 62 bpm Old anteroseptal infarct    Lexiscan (Walking with mod Bruce) Sestamibi Stress Test 02/22/2019: Nondiagnostic ECG stress. Mod Bruce protocol. No chest pain. Moderate sized mid to distal anterolateral and apical infarct without ischemia. Mild diaphragmatic attenuation artifact.  Gated SPECT imaging of the left ventricle demonstrating akinesis of the mid to distal anterior and antero apical wall.  Stress LV EF is moderately dysfunctional 32%. Intermediate risk. No previous exam available for comparison.   Echocardiogram 02/11/2019:  1. Normal size LV. Moderate hypokinesis mid to distal anteroseptal wall.  LVEF 45-50%. Grade 1 diastolic dysfunction.   2. Normal RV size and function.   3. Mild mitral leaflet thickening.   4. Mild tricuspid regurgitation. Estimated PASP 32 mmHg.   Coronary angiography and intervention 02/10/2019: STEMI in evolution LM: Normal LAD: Prox 100% occlusion. Mid 40% stenosis. Ramus: Mid 40% stenosis LCx: OM2 lateral branch with focal 40% stenosis. RCA: Minimal luminal irregularities.   Successful percutaneous coronary intervention pLAD Mechanical thrombectomy, PTCA and stent placement Resolute 3.5 X 18 mm Residual mild moderate disease best treated medically.    Wynonia Musty [facility] Short Stay Center/Anesthesiology Phone [phone] 07/05/2020 3:19  PM

## 2020-07-10 ENCOUNTER — Encounter (HOSPITAL_COMMUNITY)
Admission: RE | Disposition: A | Discharge status: Home / Self Care | Payer: Self-pay | Source: Home / Self Care | Attending: General Surgery

## 2020-07-10 ENCOUNTER — Ambulatory Visit (HOSPITAL_COMMUNITY): Payer: 59 | Admitting: Physician Assistant

## 2020-07-10 ENCOUNTER — Ambulatory Visit (HOSPITAL_COMMUNITY): Payer: 59 | Admitting: Certified Registered Nurse Anesthetist

## 2020-07-10 ENCOUNTER — Encounter (HOSPITAL_COMMUNITY): Payer: Self-pay | Admitting: General Surgery

## 2020-07-10 ENCOUNTER — Ambulatory Visit (HOSPITAL_COMMUNITY)
Admission: RE | Admit: 2020-07-10 | Discharge: 2020-07-10 | Disposition: A | Discharge status: Home / Self Care | Payer: 59 | Attending: General Surgery | Admitting: General Surgery

## 2020-07-10 DIAGNOSIS — Z8349 Family history of other endocrine, nutritional and metabolic diseases: Secondary | ICD-10-CM | POA: Diagnosis not present

## 2020-07-10 DIAGNOSIS — Z8249 Family history of ischemic heart disease and other diseases of the circulatory system: Secondary | ICD-10-CM | POA: Insufficient documentation

## 2020-07-10 DIAGNOSIS — K4020 Bilateral inguinal hernia, without obstruction or gangrene, not specified as recurrent: Secondary | ICD-10-CM | POA: Insufficient documentation

## 2020-07-10 DIAGNOSIS — Z803 Family history of malignant neoplasm of breast: Secondary | ICD-10-CM | POA: Insufficient documentation

## 2020-07-10 DIAGNOSIS — Z87891 Personal history of nicotine dependence: Secondary | ICD-10-CM | POA: Insufficient documentation

## 2020-07-10 HISTORY — PX: INSERTION OF MESH: SHX5868

## 2020-07-10 HISTORY — PX: INGUINAL HERNIA REPAIR: SHX194

## 2020-07-10 SURGERY — REPAIR, HERNIA, INGUINAL, BILATERAL, LAPAROSCOPIC
Anesthesia: General | Site: Abdomen | Laterality: Bilateral

## 2020-07-10 MED ORDER — PROPOFOL 10 MG/ML IV BOLUS
INTRAVENOUS | Status: DC | PRN
  Administered 2020-07-10: 30 mg via INTRAVENOUS
  Administered 2020-07-10: 150 mg via INTRAVENOUS

## 2020-07-10 MED ORDER — FENTANYL CITRATE (PF) 100 MCG/2ML IJ SOLN
INTRAMUSCULAR | Status: AC
  Filled 2020-07-10: qty 2

## 2020-07-10 MED ORDER — ACETAMINOPHEN 500 MG PO TABS
1000.0000 mg | ORAL_TABLET | ORAL | Status: AC
  Administered 2020-07-10: 1000 mg via ORAL
  Filled 2020-07-10: qty 2

## 2020-07-10 MED ORDER — CHLORHEXIDINE GLUCONATE CLOTH 2 % EX PADS: 6.0000 | MEDICATED_PAD | Freq: Once | CUTANEOUS | Status: DC

## 2020-07-10 MED ORDER — MIDAZOLAM HCL 5 MG/5ML IJ SOLN
INTRAMUSCULAR | Status: DC | PRN
  Administered 2020-07-10: 2 mg via INTRAVENOUS

## 2020-07-10 MED ORDER — ENSURE PRE-SURGERY PO LIQD: 296.0000 mL | Freq: Once | ORAL | Status: DC

## 2020-07-10 MED ORDER — BUPIVACAINE-EPINEPHRINE (PF) 0.25% -1:200000 IJ SOLN
INTRAMUSCULAR | Status: AC
  Filled 2020-07-10: qty 30

## 2020-07-10 MED ORDER — LACTATED RINGERS IV SOLN: INTRAVENOUS | Status: DC

## 2020-07-10 MED ORDER — TRAMADOL HCL 50 MG PO TABS: 50.0000 mg | ORAL_TABLET | Freq: Four times a day (QID) | ORAL | 0 refills | Status: DC | PRN

## 2020-07-10 MED ORDER — FENTANYL CITRATE (PF) 100 MCG/2ML IJ SOLN
25.0000 ug | INTRAMUSCULAR | Status: DC | PRN
  Administered 2020-07-10 (×2): 50 ug via INTRAVENOUS

## 2020-07-10 MED ORDER — ONDANSETRON HCL 4 MG/2ML IJ SOLN
INTRAMUSCULAR | Status: DC | PRN
  Administered 2020-07-10: 4 mg via INTRAVENOUS

## 2020-07-10 MED ORDER — ORAL CARE MOUTH RINSE: 15.0000 mL | Freq: Once | OROMUCOSAL | Status: AC

## 2020-07-10 MED ORDER — LIDOCAINE 2% (20 MG/ML) 5 ML SYRINGE
INTRAMUSCULAR | Status: DC | PRN
  Administered 2020-07-10: 50 mg via INTRAVENOUS

## 2020-07-10 MED ORDER — PROPOFOL 10 MG/ML IV BOLUS
INTRAVENOUS | Status: AC
  Filled 2020-07-10: qty 20

## 2020-07-10 MED ORDER — VANCOMYCIN HCL IN DEXTROSE 1-5 GM/200ML-% IV SOLN
1000.0000 mg | INTRAVENOUS | Status: AC
  Administered 2020-07-10: 1000 mg via INTRAVENOUS
  Filled 2020-07-10: qty 200

## 2020-07-10 MED ORDER — FENTANYL CITRATE (PF) 250 MCG/5ML IJ SOLN
INTRAMUSCULAR | Status: DC | PRN
  Administered 2020-07-10 (×3): 50 ug via INTRAVENOUS
  Administered 2020-07-10: 100 ug via INTRAVENOUS

## 2020-07-10 MED ORDER — SUGAMMADEX SODIUM 200 MG/2ML IV SOLN
INTRAVENOUS | Status: DC | PRN
  Administered 2020-07-10: 200 mg via INTRAVENOUS

## 2020-07-10 MED ORDER — BUPIVACAINE-EPINEPHRINE (PF) 0.25% -1:200000 IJ SOLN
INTRAMUSCULAR | Status: DC | PRN
  Administered 2020-07-10: 7 mL

## 2020-07-10 MED ORDER — CHLORHEXIDINE GLUCONATE 0.12 % MT SOLN
15.0000 mL | Freq: Once | OROMUCOSAL | Status: AC
  Administered 2020-07-10: 15 mL via OROMUCOSAL
  Filled 2020-07-10: qty 15

## 2020-07-10 MED ORDER — ROCURONIUM BROMIDE 10 MG/ML (PF) SYRINGE
PREFILLED_SYRINGE | INTRAVENOUS | Status: DC | PRN
  Administered 2020-07-10: 80 mg via INTRAVENOUS

## 2020-07-10 MED ORDER — 0.9 % SODIUM CHLORIDE (POUR BTL) OPTIME
TOPICAL | Status: DC | PRN
  Administered 2020-07-10: 1000 mL

## 2020-07-10 MED ORDER — DEXAMETHASONE SODIUM PHOSPHATE 10 MG/ML IJ SOLN
INTRAMUSCULAR | Status: DC | PRN
  Administered 2020-07-10: 5 mg via INTRAVENOUS

## 2020-07-10 MED ORDER — MIDAZOLAM HCL 2 MG/2ML IJ SOLN
INTRAMUSCULAR | Status: AC
  Filled 2020-07-10: qty 2

## 2020-07-10 MED ORDER — FENTANYL CITRATE (PF) 250 MCG/5ML IJ SOLN
INTRAMUSCULAR | Status: AC
  Filled 2020-07-10: qty 5

## 2020-07-10 SURGICAL SUPPLY — 40 items
ADH SKN CLS APL DERMABOND .7 (GAUZE/BANDAGES/DRESSINGS) ×1
BAG COUNTER SPONGE SURGICOUNT (BAG) ×3 IMPLANT
BAG SURGICOUNT SPONGE COUNTING (BAG) ×1
CANISTER SUCT 3000ML PPV (MISCELLANEOUS) IMPLANT
CHLORAPREP W/TINT 26 (MISCELLANEOUS) ×4 IMPLANT
COVER SURGICAL LIGHT HANDLE (MISCELLANEOUS) ×4 IMPLANT
DERMABOND ADVANCED (GAUZE/BANDAGES/DRESSINGS) ×2
DERMABOND ADVANCED .7 DNX12 (GAUZE/BANDAGES/DRESSINGS) ×2 IMPLANT
DRAPE LAPAROSCOPIC ABDOMINAL (DRAPES) ×4 IMPLANT
ELECT REM PT RETURN 9FT ADLT (ELECTROSURGICAL) ×4
ELECTRODE REM PT RTRN 9FT ADLT (ELECTROSURGICAL) ×2 IMPLANT
ENDOLOOP SUT PDS II  0 18 (SUTURE) ×4
ENDOLOOP SUT PDS II 0 18 (SUTURE) ×1 IMPLANT
GLOVE SURG ENC MOIS LTX SZ7.5 (GLOVE) ×4 IMPLANT
GOWN STRL REUS W/ TWL LRG LVL3 (GOWN DISPOSABLE) ×4 IMPLANT
GOWN STRL REUS W/ TWL XL LVL3 (GOWN DISPOSABLE) ×2 IMPLANT
GOWN STRL REUS W/TWL LRG LVL3 (GOWN DISPOSABLE) ×8
GOWN STRL REUS W/TWL XL LVL3 (GOWN DISPOSABLE) ×4
KIT BASIN OR (CUSTOM PROCEDURE TRAY) ×4 IMPLANT
KIT TURNOVER KIT B (KITS) ×4 IMPLANT
MESH 3DMAX 5X7 LT XLRG (Mesh General) ×3 IMPLANT
MESH 3DMAX 5X7 RT XLRG (Mesh General) ×3 IMPLANT
NDL INSUFFLATION 14GA 120MM (NEEDLE) IMPLANT
NEEDLE INSUFFLATION 14GA 120MM (NEEDLE) ×4 IMPLANT
NS IRRIG 1000ML POUR BTL (IV SOLUTION) ×4 IMPLANT
PAD ARMBOARD 7.5X6 YLW CONV (MISCELLANEOUS) ×8 IMPLANT
RELOAD STAPLE 4.0 BLU F/HERNIA (INSTRUMENTS) IMPLANT
RELOAD STAPLE HERNIA 4.0 BLUE (INSTRUMENTS) ×4 IMPLANT
SCISSORS LAP 5X35 DISP (ENDOMECHANICALS) ×4 IMPLANT
SET IRRIG TUBING LAPAROSCOPIC (IRRIGATION / IRRIGATOR) IMPLANT
SET TUBE SMOKE EVAC HIGH FLOW (TUBING) ×4 IMPLANT
SUT MNCRL AB 4-0 PS2 18 (SUTURE) ×4 IMPLANT
TOWEL GREEN STERILE (TOWEL DISPOSABLE) ×4 IMPLANT
TOWEL GREEN STERILE FF (TOWEL DISPOSABLE) ×4 IMPLANT
TRAY LAPAROSCOPIC MC (CUSTOM PROCEDURE TRAY) ×4 IMPLANT
TROCAR OPTICAL SHORT 5MM (TROCAR) ×4 IMPLANT
TROCAR OPTICAL SLV SHORT 5MM (TROCAR) ×4 IMPLANT
TROCAR XCEL 12X100 BLDLESS (ENDOMECHANICALS) ×4 IMPLANT
WARMER LAPAROSCOPE (MISCELLANEOUS) ×4 IMPLANT
WATER STERILE IRR 1000ML POUR (IV SOLUTION) ×4 IMPLANT

## 2020-07-10 NOTE — H&P (Signed)
History of Present Illness The patient is a 65 year old male who presents with an inguinal hernia. Referred by: Dr. Moreen Fowler Chief Complaint: Left inguinal hernia   Patient is a 65 year old male, with a 5+ year history of a left inguinal hernia.  Patient states that recently he was lifting something from the ground and had some pain to the left inguinal area.  He states that he has noticed a bulge in that area prior to discontinuing noticed a bulge.  He does state that the bulge does flatten out when he lays down.  He's had no signs or symptoms of incarceration or strangulation.  .  Patient also has a history of a L4-5 HNP and repair.   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   Patient had a previous open appendectomy as a child       Past Surgical History Appendectomy   Colon Polyp Removal - Colonoscopy   Spinal Surgery - Lower Back   Tonsillectomy     Diagnostic Studies History Colonoscopy   1-5 years ago   Allergies No Known Drug Allergies   [05/10/2020]: Allergies Reconciled     Medication History Carvedilol  (6.25MG  Tablet, Oral) Active. Pravastatin Sodium  (40MG  Tablet, Oral) Active. Olmesartan Medoxomil  (5MG  Tablet, Oral) Active. Aspirin  (81MG  Tablet Chewable, Oral) Active. B12 Folate  (800-800MCG Capsule, Oral) Active. D3 Vitamin  (10MCG/ML Liquid, Oral) Active. Viteyes AREDS Advanced  (Oral) Active. Sildenafil Citrate  (10MG /12.5ML Solution, Intravenous) Active. Medications Reconciled    Social History Alcohol use   Occasional alcohol use. Caffeine use   Carbonated beverages, Tea. No drug use   Tobacco use   Former smoker.   Family History Breast Cancer   Sister. Cancer   Sister. Depression   Daughter. Heart Disease   Father. Hypertension   Brother, Father, Mother. Thyroid problems   Daughter.   Other Problems High blood pressure   Hypercholesterolemia   Inguinal Hernia   Kidney Stone   Melanoma   Myocardial infarction          Review of Systems General Not Present- Appetite Loss, Chills, Fatigue, Fever, Night Sweats, Weight Gain and Weight Loss. Skin Not Present- Change in Wart/Mole, Dryness, Hives, Jaundice, New Lesions, Non-Healing Wounds, Rash and Ulcer. HEENT Present- Wears glasses/contact lenses. Not Present- Earache, Hearing Loss, Hoarseness, Nose Bleed, Oral Ulcers, Ringing in the Ears, Seasonal Allergies, Sinus Pain, Sore Throat, Visual Disturbances and Yellow Eyes. Respiratory Not Present- Bloody sputum, Chronic Cough, Difficulty Breathing, Snoring and Wheezing. Breast Not Present- Breast Mass, Breast Pain, Nipple Discharge and Skin Changes. Cardiovascular Not Present- Chest Pain, Difficulty Breathing Lying Down, Leg Cramps, Palpitations, Rapid Heart Rate, Shortness of Breath and Swelling of Extremities. Gastrointestinal Not Present- Abdominal Pain, Bloating, Bloody Stool, Change in Bowel Habits, Chronic diarrhea, Constipation, Difficulty Swallowing, Excessive gas, Gets full quickly at meals, Hemorrhoids, Indigestion, Nausea, Rectal Pain and Vomiting. Male Genitourinary Not Present- Blood in Urine, Change in Urinary Stream, Frequency, Impotence, Nocturia, Painful Urination, Urgency and Urine Leakage. Musculoskeletal Not Present- Back Pain, Joint Pain, Joint Stiffness, Muscle Pain, Muscle Weakness and Swelling of Extremities. Neurological Not Present- Decreased Memory, Fainting, Headaches, Numbness, Seizures, Tingling, Tremor, Trouble walking and Weakness. Psychiatric Not Present- Anxiety, Bipolar, Change in Sleep Pattern, Depression, Fearful and Frequent crying. Endocrine Not Present- Cold Intolerance, Excessive Hunger, Hair Changes, Heat Intolerance, Hot flashes and New Diabetes. Hematology Not Present- Blood Thinners, Easy Bruising, Excessive bleeding, Gland problems, HIV and Persistent Infections. All other systems negative  BP (!) 146/70   Pulse 62   Temp 97.9 F (36.6 C) (Oral)   Resp 20   Ht 5'  10" (1.778 m)   Wt 82.9 kg   SpO2 98%   BMI 26.22 kg/m       Physical Exam The physical exam findings are as follows: Note:   Constitutional: No acute distress, conversant, appears stated age   Eyes: Anicteric sclerae, moist conjunctiva, no lid lag   Neck: No thyromegaly, trachea midline, no cervical lymphadenopathy   Lungs: Clear to auscultation biilaterally, normal respiratory effot   Cardiovascular: regular rate & rhythm, no murmurs, no peripheal edema, pedal pulses 2+   GI: Soft, no masses or hepatosplenomegaly, non-tender to palpation   MSK: Normal gait, no clubbing cyanosis, edema   Skin: No rashes, palpation reveals normal skin turgor   Psychiatric: Appropriate judgment and insight, oriented to person, place, and time   Abdomen Inspection Hernias - Inguinal hernia - Bilateral - Reducible - Bilateral  (L>R) .       Assessment & Plan BILATERAL INGUINAL HERNIA WITHOUT OBSTRUCTION OR GANGRENE, RECURRENCE NOT SPECIFIED (K40.20) Impression: 65 year old male with a left greater than right inguinal hernia.   We'll get cardiac clearance by Dr. Gailen Shelter. 1. The patient will like to proceed to the operating room for laparoscopic bilateral inguinal hernia repair with mesh.   2. I discussed with the patient the signs and symptoms of incarceration and strangulation and the need to proceed to the ER should they occur.   3. I discussed with the patient the risks and benefits of the procedure to include but not limited to: Infection, bleeding, damage to surrounding structures, possible need for further surgery, possible nerve pain, and possible recurrence. The patient was understanding and wishes to proceed.

## 2020-07-10 NOTE — Discharge Instructions (Signed)
[facility] _______[facility] ? ?INGUINAL HERNIA REPAIR: POST OP INSTRUCTIONS ? ?Always review your discharge instruction sheet given to you by the facility where your surgery was performed. ?IF YOU HAVE DISABILITY OR FAMILY LEAVE FORMS, YOU MUST BRING THEM TO THE OFFICE FOR PROCESSING.   ?DO NOT GIVE THEM TO YOUR DOCTOR. ? ?1. A  prescription for pain medication may be given to you upon discharge.  Take your pain medication as prescribed, if needed.  If narcotic pain medicine is not needed, then you may take acetaminophen (Tylenol) or ibuprofen (Advil) as needed. ?2. Take your usually prescribed medications unless otherwise directed. ?If you need a refill on your pain medication, please contact your pharmacy.  They will contact our office to request authorization. Prescriptions will not be filled after 5 pm or on week-ends. ?3. You should follow a light diet the first 24 hours after arrival home, such as soup and crackers, etc.  Be sure to include lots of fluids daily.  Resume your normal diet the day after surgery. ?4.Most patients will experience some swelling and bruising around the umbilicus or in the groin and scrotum.  Ice packs and reclining will help.  Swelling and bruising can take several days to resolve.  ?6. It is common to experience some constipation if taking pain medication after surgery.  Increasing fluid intake and taking a stool softener (such as Colace) will usually help or prevent this problem from occurring.  A mild laxative (Milk of Magnesia or Miralax) should be taken according to package directions if there are no bowel movements after 48 hours. ?7. Unless discharge instructions indicate otherwise, you may remove your bandages 24-48 hours after surgery, and you may shower at that time.  You may have steri-strips (small skin tapes) in place directly over the incision.  These strips should be left on the skin for 7-10 days.  If your surgeon used skin glue on the incision, you may  shower in 24 hours.  The glue will flake off over the next 2-3 weeks.  Any sutures or staples will be removed at the office during your follow-up visit. ?8. ACTIVITIES:  You may resume regular (light) daily activities beginning the next day--such as daily self-care, walking, climbing stairs--gradually increasing activities as tolerated.  You may have sexual intercourse when it is comfortable.  Refrain from any heavy lifting or straining until approved by your doctor. ? ?a.You may drive when you are no longer taking prescription pain medication, you can comfortably wear a seatbelt, and you can safely maneuver your car and apply brakes. ?b.RETURN TO WORK:   ?_____________________________________________ ? ?9.You should see your doctor in the office for a follow-up appointment approximately 2-3 weeks after your surgery.  Make sure that you call for this appointment within a day or two after you arrive home to insure a convenient appointment time. ?10.OTHER INSTRUCTIONS: _________________________ ?   _____________________________________ ? ?WHEN TO CALL YOUR DOCTOR: ?Fever over 101.0 ?Inability to urinate ?Nausea and/or vomiting ?Extreme swelling or bruising ?Continued bleeding from incision. ?Increased pain, redness, or drainage from the incision ? ?The clinic staff is available to answer your questions during regular business hours.  Please don?t hesitate to call and ask to speak to one of the nurses for clinical concerns.  If you have a medical emergency, go to the nearest emergency room or call 911.  A surgeon from [facility] is always on call at the hospital ? ? ?[address], [address], [place], [place]    [address] ? ? [address] [address], [place], [place]   [address] ?[phone] ? [phone] ? FAX [phone] ?Web site: [email or web address] ? ?

## 2020-07-10 NOTE — Op Note (Signed)
07/10/2020  1:14 PM  PATIENT:  [name]  65 y.o. male  PRE-OPERATIVE DIAGNOSIS:  BILATERAL INGUINAL HERNIAS  POST-OPERATIVE DIAGNOSIS:  BILATERAL DIRECT INGUINAL HERNIAS  PROCEDURE:  Procedure(s): LAPAROSCOPIC BILATERAL INGUINAL HERNIA REPAIR WITH MESH (Bilateral)  SURGEON:  Surgeon(s) and Role:    * Ralene Ok, MD - Primary  ASSISTANTS: Lucas Mallow, PGY-3   ANESTHESIA:   general  EBL:  minimal   BLOOD ADMINISTERED:none  DRAINS: none   LOCAL MEDICATIONS USED:  BUPIVICAINE   SPECIMEN:  No Specimen  DISPOSITION OF SPECIMEN:  N/A  COUNTS:  YES  TOURNIQUET:  * No tourniquets in log *  DICTATION: .Dragon Dictation    Counts: reported as correct x 2  Findings:  The patient had a small direct right & large left direct hernia  Indications for procedure:  The patient is a 65 year old male with a bilateral inguinal herniar for several months. Patient complained of symptomatology to his bilateral inguinal areas. The patient was taken back for elective inguinal hernia repair.  Details of the procedure: The patient was taken back to the operating room. The patient was placed in supine position with bilateral SCDs in place.  The patient underwent GETA.  The patient was prepped and draped in the usual sterile fashion.  After appropriate anitbiotics were confirmed, a time-out was confirmed and all facts were verified.  0.25% Marcaine was used to infiltrate the umbilical area. A 11-blade was used to cut down the skin and blunt dissection was used to get the anterior fashion.  The anterior fascia was incised approximately 1 cm and the muscles were retracted laterally. Blunt dissection was then used to create a space in the preperitoneal area. At this time a 10 mm camera was then introduced into the space and advanced the pubic tubercle and a 12 mm trocar was placed over this and insufflation was started.  At this time and space was created from medial to laterally the  preperitoneal space.  Cooper's ligament was initially cleaned off.  The hernia sac was identified and dissected away from the cremesteric muscle fibers.  The hernia was seen in the direct space.  The transversalis fascia retracted spontaneously. Dissection of the spermatic cord was undertaken the vas deferens was identified and protected in all parts of the case.   There was a small tear into the hernia sac. A Veress needle right upper quadrant to help evacuate the intraperitoneal air.  A 0 PDS endoloop was used to ligate the hole  Once the peritoneum was taken down to approximately the umbilicus a Bard 3D Max mesh, size: [name], was introduced into the preperitoneal space.  The mesh was brought over to cover the direct and indirect hernia spaces.  This was anchored into place and secured to Cooper's ligament with 4.13mm staples from a Coviden hernia stapler. It was anchored to the anterior abdominal wall with 4.8 mm staples. The hernia sac was seen lying posterior to the mesh. There was no staples placed laterally.   The exact same dissection took place on the opposite side. The spermatic cord was identified.  The hernia sac was identified and seen to be in the direct space.  It was dissected and the transversalis fascia retracted spontaneously.  The spermatic cord was found and dissected away from the cremesteric muscle fibers.  Dissection of the spermatic cord was undertaken the vas deferens was identified and protected in all parts of the case.   Once the peritoneum was taken down to approximately the  umbilicus a Bard 3D Max mesh, size: [name], was introduced into the preperitoneal space.  The mesh was brought over to cover the direct and indirect hernia spaces.  This was anchored into place and secured to Cooper's ligament with 4.3mm staples from a Cov[place] hernia stapler. It was anchored to the anterior abdominal wall with 4.8 mm staples. The hernia sac was seen lying posterior to the mesh. There was no  staples placed laterally.   The insufflation was evacuated and the peritoneum was seen posterior to the mesh bilaterally. The trochars were removed. The anterior fascia was reapproximated using #1 Vicryl on a UR- 6.  Intra-abdominal air was evacuated and the Veress needle removed. The skin was reapproximated using 4-0 Monocryl subcuticular fashion and was dressed with Dermabond. The  patient was awakened from general anesthesia and taken to recovery in stable condition.  I was personally present during the key and critical portions of this procedure and immediately available throughout the entire procedure, as documented in my operative note.   PLAN OF CARE: Discharge to home after PACU  PATIENT DISPOSITION:  PACU - hemodynamically stable.   Delay start of Pharmacological VTE agent (>24hrs) due to surgical blood loss or risk of bleeding: not applicable

## 2020-07-10 NOTE — Transfer of Care (Signed)
Immediate Anesthesia Transfer of Care Note  Patient: [name]  Procedure(s) Performed: LAPAROSCOPIC BILATERAL INGUINAL HERNIA REPAIR WITH MESH (Bilateral: Abdomen)  Patient Location: PACU  Anesthesia Type:General  Level of Consciousness: drowsy and patient cooperative  Airway & Oxygen Therapy: Patient Spontanous Breathing and Patient connected to nasal cannula oxygen  Post-op Assessment: Report given to RN, Post -op Vital signs reviewed and stable and Patient moving all extremities X 4  Post vital signs: Reviewed and stable  Last Vitals:  Vitals Value Taken Time  BP 153/99 07/10/20 1330  Temp    Pulse 60 07/10/20 1333  Resp 26 07/10/20 1333  SpO2 98 % 07/10/20 1333  Vitals shown include unvalidated device data.  Last Pain:  Vitals:   07/10/20 0849  TempSrc: Oral         Complications: No notable events documented.

## 2020-07-10 NOTE — Anesthesia Procedure Notes (Signed)
Procedure Name: Intubation Date/Time: 07/10/2020 12:20 PM Performed by: Betha Loa, CRNA Pre-anesthesia Checklist: Patient identified, Emergency Drugs available, Suction available and Patient being monitored Patient Re-evaluated:Patient Re-evaluated prior to induction Oxygen Delivery Method: Circle System Utilized Preoxygenation: Pre-oxygenation with 100% oxygen Induction Type: IV induction Ventilation: Mask ventilation without difficulty Laryngoscope Size: 4 and Mac Grade View: Grade I Tube type: Oral Tube size: 7.5 mm Number of attempts: 2 Airway Equipment and Method: Video-laryngoscopy and Bougie stylet Placement Confirmation: ETT inserted through vocal cords under direct vision, positive ETCO2 and breath sounds checked- equal and bilateral Secured at: 22 cm Tube secured with: Tape Dental Injury: Teeth and Oropharynx as per pre-operative assessment  Comments: MAC 4, grade 3 view. 2nd look with Glidescope MAC4, G1 view--atraumatic, successful intubation. CTM

## 2020-07-11 ENCOUNTER — Encounter (HOSPITAL_COMMUNITY): Payer: Self-pay | Admitting: General Surgery

## 2020-07-11 NOTE — Anesthesia Postprocedure Evaluation (Signed)
Anesthesia Post Note  Patient: Rollen Selders Sturkey  Procedure(s) Performed: LAPAROSCOPIC BILATERAL INGUINAL HERNIA REPAIR (Bilateral: Abdomen) INSERTION OF MESH (Bilateral: Groin)     Patient location during evaluation: PACU Anesthesia Type: General Level of consciousness: awake and alert Pain management: pain level controlled Vital Signs Assessment: post-procedure vital signs reviewed and stable Respiratory status: spontaneous breathing, nonlabored ventilation, respiratory function stable and patient connected to nasal cannula oxygen Cardiovascular status: blood pressure returned to baseline and stable Postop Assessment: no apparent nausea or vomiting Anesthetic complications: no   No notable events documented.  Last Vitals:  Vitals:   07/10/20 1430 07/10/20 1445  BP: 136/70 116/79  Pulse: (!) 50 (!) 50  Resp: 15 19  Temp:  36.5 C  SpO2: 95% 99%    Last Pain:  Vitals:   07/10/20 1400  TempSrc:   PainSc: 8                  Lisette Mancebo L Jessicia Napolitano

## 2021-02-12 ENCOUNTER — Other Ambulatory Visit: Payer: Self-pay

## 2021-02-12 ENCOUNTER — Encounter: Payer: Self-pay | Admitting: Cardiology

## 2021-02-12 ENCOUNTER — Ambulatory Visit: Payer: 59 | Admitting: Cardiology

## 2021-02-12 VITALS — BP 155/87 | HR 57 | Temp 98.1°F | Resp 16 | Ht 70.0 in | Wt 192.0 lb

## 2021-02-12 DIAGNOSIS — I10 Essential (primary) hypertension: Secondary | ICD-10-CM

## 2021-02-12 DIAGNOSIS — I2510 Atherosclerotic heart disease of native coronary artery without angina pectoris: Secondary | ICD-10-CM

## 2021-02-12 DIAGNOSIS — E782 Mixed hyperlipidemia: Secondary | ICD-10-CM

## 2021-02-12 MED ORDER — AMLODIPINE BESYLATE 5 MG PO TABS: 5.0000 mg | ORAL_TABLET | Freq: Every day | ORAL | 1 refills | Status: DC

## 2021-02-12 NOTE — Progress Notes (Signed)
Follow up visit  Subjective:   [name], male    DOB: [birth year], 66 y.o.   MRN: [id number]   HPI   Chief Complaint  Patient presents with   Coronary Artery Disease   Follow-up    1 year    66 y.o. Caucasian male  with hypertension, hyperlipidemia, CAD (STEMI 01/2019) treated with primary PCI to pLAD.   Patient is doing well, denies chest pain, shortness of breath, palpitations, leg edema, orthopnea, PND, TIA/syncope. In 10/2019, he was switched to pravastatin by his PCP due to myalgias. He has not tolerated Crestor and :Lipitor.  Lipids have remained well controlled.  Blood pressure slightly elevated today, generally runs 130-140 mmHg SBP.   Current Outpatient Medications on File Prior to Visit  Medication Sig Dispense Refill   acetaminophen (TYLENOL) 500 MG tablet Take 500-1,000 mg by mouth every 6 (six) hours as needed (for headaches).     aspirin EC 81 MG tablet Take 81 mg by mouth daily.     carvedilol (COREG) 6.25 MG tablet Take 1 tablet (6.25 mg total) by mouth 2 (two) times daily. 180 tablet 3   Cholecalciferol (VITAMIN D3) 50 MCG (2000 UT) TABS Take 4,000 Units by mouth daily.     Multiple Vitamins-Minerals (PRESERVISION AREDS 2) CAPS Take 1 capsule by mouth daily.     nitroGLYCERIN (NITROSTAT) 0.4 MG SL tablet Place 1 tablet (0.4 mg total) under the tongue every 5 (five) minutes as needed for chest pain. 30 tablet 1   olmesartan (BENICAR) 40 MG tablet Take 40 mg by mouth daily.     pravastatin (PRAVACHOL) 40 MG tablet Take 40 mg by mouth daily.     sildenafil (REVATIO) 20 MG tablet Take 40-100 mg by mouth daily as needed (ED).     vitamin B-12 (CYANOCOBALAMIN) 1000 MCG tablet 1 tablet     No current facility-administered medications on file prior to visit.    Cardiovascular & other pertient studies:  EKG 02/12/2021: Sinus rhythm 56 bpm Old anteroseptal infarct  Lexiscan (Walking with mod Bruce) Sestamibi Stress Test 02/22/2019: Nondiagnostic ECG stress. Mod  Bruce protocol. No chest pain. Moderate sized mid to distal anterolateral and apical infarct without ischemia. Mild diaphragmatic attenuation artifact.  Gated SPECT imaging of the left ventricle demonstrating akinesis of the mid to distal anterior and antero apical wall.  Stress LV EF is moderately dysfunctional 32%.  Intermediate risk. No previous exam available for comparison.  Echocardiogram 02/11/2019:  1. Normal size LV. Moderate hypokinesis mid to distal anteroseptal wall.  LVEF 45-50%. Grade 1 diastolic dysfunction.   2. Normal RV size and function.   3. Mild mitral leaflet thickening.   4. Mild tricuspid regurgitation. Estimated PASP 32 mmHg.   Coronary angiography and intervention 02/10/2019: STEMI in evolution LM: Normal LAD: Prox 100% occlusion. Mid 40% stenosis. Ramus: Mid 40% stenosis LCx: OM2 lateral branch with focal 40% stenosis. RCA: Minimal luminal irregularities.    Successful percutaneous coronary intervention pLAD Mechanical thrombectomy, PTCA and stent placement Resolute 3.5 X 18 mm Residual mild moderate disease best treated medically.     Recent labs: 07/04/2020: Glucose 98, BUN/Cr 26/1.07. EGFR >60. Na/K 139/4.0.  H/H 14/43. MCV 94. Platelets 299  02/2020: Chol 114, TG 73, HDL 36, LDL 63  10/10/2019: Glucose 92, BUN/Cr 18/0.87. EGFR 60. HbA1C 5.7% Chol 113, TG 66, HDL 37, LDL 62 TSH 1.4 normal  04/07/2019: Glucose 109. BUN/Cr 21/1.0 Chol 103, TG 67, HDL 36, LDL 52  02/11/2019: Glucose 134, BUN/Cr  13/0.93. EGFR >60. Na/K 140/3.5. Rest of the CMP normal H/H 12.5/49.8. MCV 91. Platelets 251 HbA1C 5.7% Chol 145, TG 128, HDL 28, LDL 91   Review of Systems  Cardiovascular:  Negative for chest pain, dyspnea on exertion, leg swelling, palpitations and syncope.  Respiratory:  Negative for shortness of breath.        Vitals:   02/12/21 1425 02/12/21 1431  BP: (!) 150/85 (!) 158/86  Pulse: (!) 55 (!) 56  Resp: 16   Temp: 98.1 F (36.7 C)   SpO2:  97%      Body mass index is 27.55 kg/m. Filed Weights   02/12/21 1425  Weight: 192 lb (87.1 kg)     Objective:   Physical Exam Vitals and nursing note reviewed.  Constitutional:      Appearance: He is well-developed.  Neck:     Vascular: No JVD.  Cardiovascular:     Rate and Rhythm: Normal rate and regular rhythm.     Pulses: Intact distal pulses.     Heart sounds: Normal heart sounds. No murmur heard. Pulmonary:     Effort: Pulmonary effort is normal.     Breath sounds: Normal breath sounds. No wheezing or rales.  Musculoskeletal:     Right lower leg: No edema.     Left lower leg: No edema.          Assessment & Recommendations:   66 y.o. Caucasian male  with hypertension, hyperlipidemia, CAD (STEMI 01/2019) treated with primary PCI to pLAD.   CAD without angina: STEMI 01/2019 treated with primary PCI to proximal LAD. Residual moderate stenosis in OM2 and mid LAD, without any ischemia on stress test (02/2019). Old anterolateral infarct seen., Continue aspirin Two years post MI.  Okay to stop carvedilol.  Replaced by amlodipine 5 mg daily.  If tolerated well by patient with adequate blood pressure control, this may be refilled by Dr. [place] in the future.   See below re: lipid management  Hypertension: As above.  Mixed hyperlipidemia: Currently on pravastatin with well-controlled lipid panel.  F/u in 1 year  Nigel Mormon, MD [facility] Pager: [phone] Office: [phone]

## 2021-03-20 IMAGING — MR MR ABDOMEN WO/W CM
27 series · 48 of 48 positions shown · IV contrast (17 ml multihance)
Comparison: MRI study from Wednesday February, 2019

CLINICAL DATA: Abnormality seen on previous MRI in the liver in a
patient with renal cyst.

EXAM:
MRI ABDOMEN WITHOUT AND WITH CONTRAST
TECHNIQUE: Multiplanar multisequence MR imaging of the abdomen was performed
both before and after the administration of intravenous contrast.
CONTRAST:  17mL MULTIHANCE GADOBENATE DIMEGLUMINE 529 MG/ML IV SOLN

[Series 3: T2 · coronal · 5.0mm · 1.56mm/px · 1 of 38 slices shown (1 of 2)]
[im 1/38]
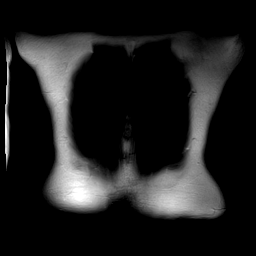

[Series 4: T1 · axial · 3.0mm · 1.19mm/px · z∈[-101,+136]mm · 3 of 160 slices shown]
[im 1/160]
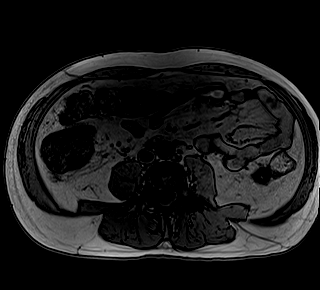
[im 80/160]
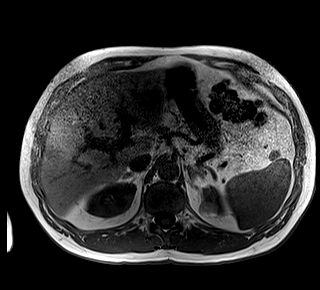
[im 160/160]
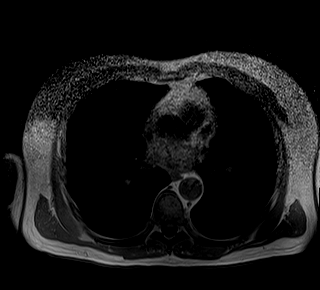

[Series 5: bSSFP · axial · 5.0mm · 1.25mm/px · 1 of 40 slices shown]
[im 1/40]
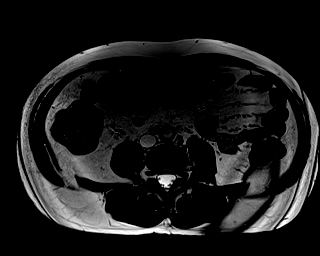

[Series 6: T1 dynamic · axial · 3.5mm · 1.41mm/px · 1 of 64 slices shown (1 of 15)]
[im 1/64]
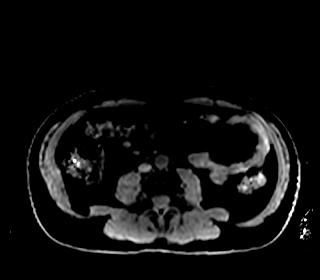

[Series 7: T1 dynamic · axial · 3.5mm · 1.41mm/px · 1 of 63 slices shown (2 of 15)]
[im 1/63]
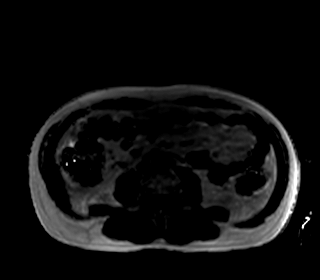

[Series 9: T1 dynamic · axial · 3.5mm · 2.81mm/px · 1 of 64 slices shown (3 of 15)]
[im 1/64]
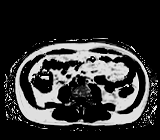

[Series 10: T1 dynamic · axial · 3.5mm · 2.81mm/px · z∈[-93,+127]mm · 2 of 64 slices shown (4 of 15)]
[im 1/64]
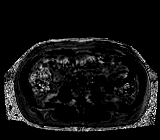
[im 64/64]
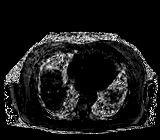

[Series 11: T1 dynamic · axial · 3.5mm · 2.81mm/px · z∈[-93,+127]mm · 2 of 64 slices shown (5 of 15)]
[im 1/64]
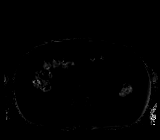
[im 64/64]
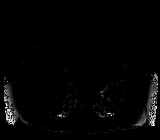

[Series 12: T1 dynamic · axial · 3.5mm · 2.81mm/px · z∈[-93,+127]mm · 2 of 64 slices shown (6 of 15)]
[im 1/64]
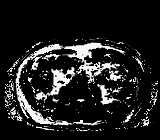
[im 64/64]
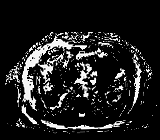

[Series 13: T1 dynamic · axial · 3.5mm · 2.81mm/px · z∈[-93,+127]mm · 2 of 64 slices shown (7 of 15)]
[im 1/64]
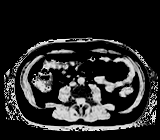
[im 64/64]
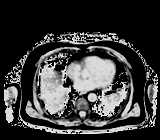

[Series 15: DWI · axial · 5.0mm · 1.42mm/px · z∈[-58,+152]mm · 3 of 108 slices shown (1 of 2)]
[im 1/108]
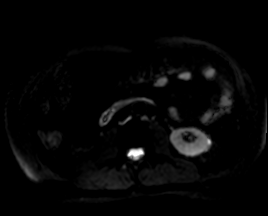
[im 54/108]
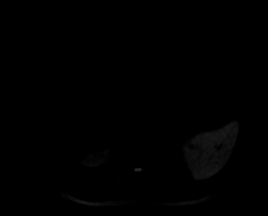
[im 108/108]
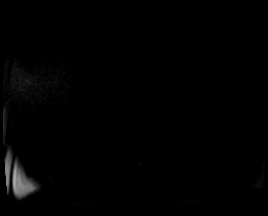

[Series 16: DWI · axial · 5.0mm · 1.42mm/px · 1 of 36 slices shown (2 of 2)]
[im 1/36]
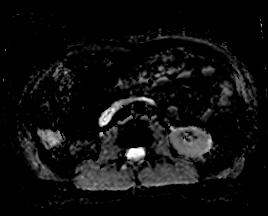

[Series 17: T2 fat-sat · axial · 5.0mm · 1.48mm/px · 1 of 38 slices shown]
[im 1/38]
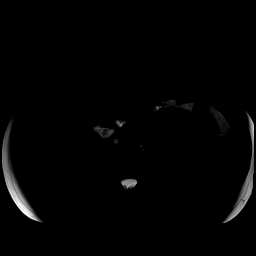

[Series 18: T2 · axial · 6.0mm · 1.19mm/px · 1 of 30 slices shown (2 of 2)]
[im 1/30]
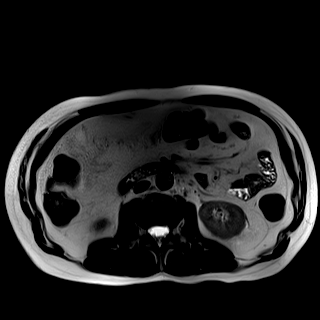

[Series 19: T1 dynamic · axial · non-contrast · 3.0mm · 1.25mm/px · z∈[-104,+133]mm · 2 of 80 slices shown (8 of 15)]
[im 1/80]
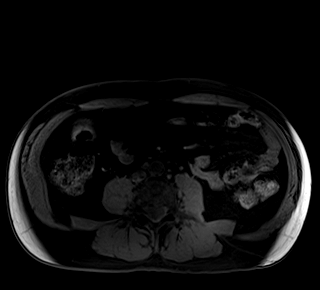
[im 80/80]
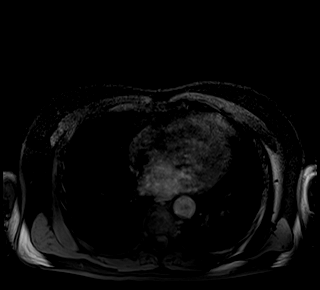

[Series 22: T1 dynamic · axial · 3.5mm · 1.41mm/px · z∈[-93,+127]mm · 2 of 64 slices shown (9 of 15)]
[im 1/64]
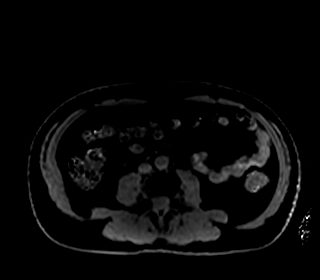
[im 64/64]
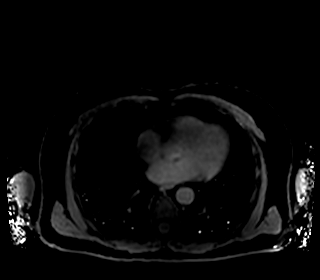

[Series 23: T1 dynamic · axial · 3.5mm · 1.41mm/px · z∈[-93,+127]mm · 2 of 62 slices shown (10 of 15)]
[im 1/62]
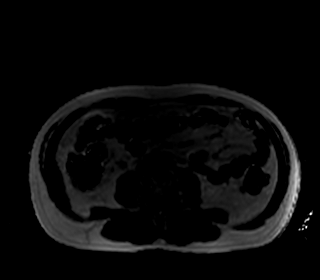
[im 62/62]
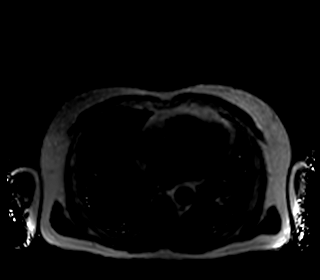

[Series 25: T1 dynamic · axial · 3.5mm · 2.81mm/px · z∈[-93,+127]mm · 2 of 64 slices shown (11 of 15)]
[im 1/64]
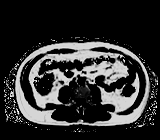
[im 64/64]
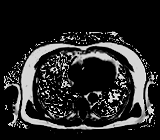

[Series 26: T1 dynamic · axial · 3.5mm · 2.81mm/px · z∈[-93,+127]mm · 2 of 64 slices shown (12 of 15)]
[im 1/64]
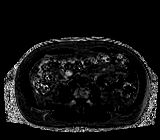
[im 64/64]
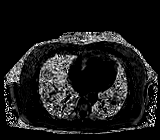

[Series 27: T1 dynamic · axial · 3.5mm · 2.81mm/px · z∈[-93,+127]mm · 2 of 64 slices shown (13 of 15)]
[im 1/64]
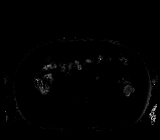
[im 64/64]
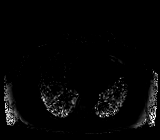

[Series 28: T1 dynamic · axial · 3.5mm · 2.81mm/px · z∈[-93,+127]mm · 2 of 64 slices shown (14 of 15)]
[im 1/64]
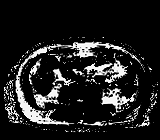
[im 64/64]
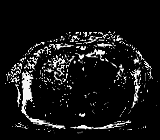

[Series 29: T1 dynamic · axial · 3.5mm · 2.81mm/px · z∈[-93,+127]mm · 2 of 64 slices shown (15 of 15)]
[im 1/64]
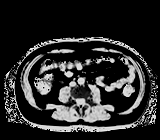
[im 64/64]
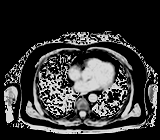

[Series 30: T1 dynamic post-contrast · axial · 3.0mm · 1.25mm/px · z∈[-104,+133]mm · 2 of 80 slices shown (1 of 5)]
[im 1/80]
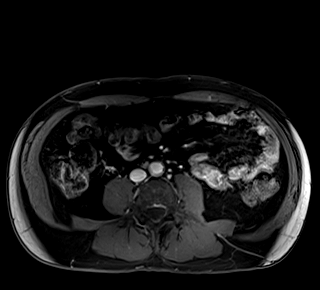
[im 80/80]
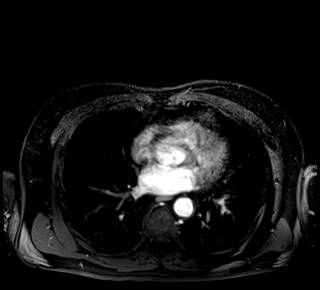

[Series 31: T1 dynamic post-contrast · axial · 3.0mm · 1.25mm/px · z∈[-104,+133]mm · 2 of 80 slices shown (2 of 5)]
[im 1/80]
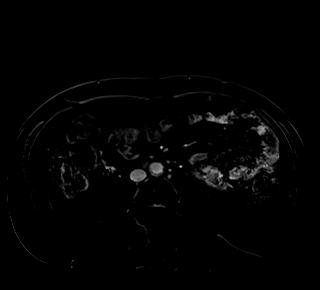
[im 80/80]
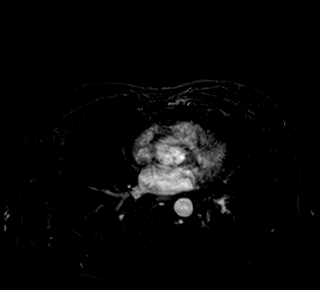

[Series 32: T1 dynamic post-contrast · axial · 3.0mm · 1.25mm/px · z∈[-104,+133]mm · 2 of 80 slices shown (3 of 5)]
[im 1/80]
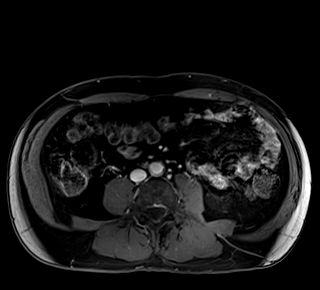
[im 80/80]
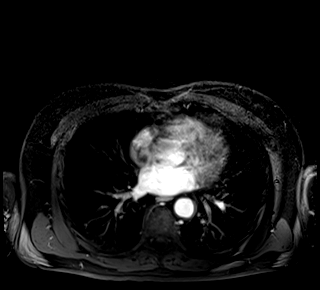

[Series 33: T1 dynamic post-contrast · axial · 3.0mm · 1.25mm/px · z∈[-104,+133]mm · 2 of 80 slices shown (4 of 5)]
[im 1/80]
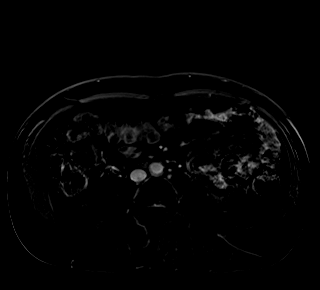
[im 80/80]
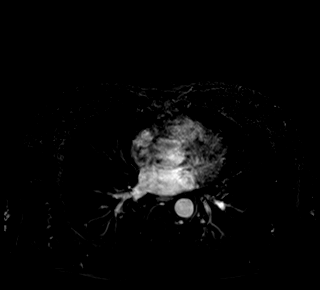

[Series 34: T1 dynamic post-contrast · coronal · 3.0mm · 1.25mm/px · 2 of 80 slices shown (5 of 5)]
[im 1/80]
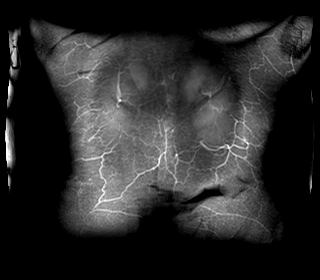
[im 80/80]
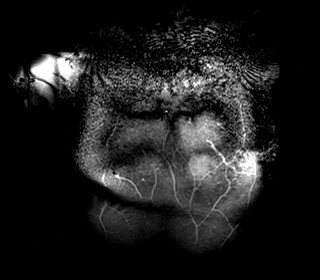

[48 of 48 positions shown; findings below may reference images not displayed]

FINDINGS: Lower chest: Lung bases are clear with limited assessment on MRI.

Hepatobiliary: Areas of concern in the liver are no longer
visualized, first phase of postcontrast enhancement is not as early
as on the prior study. Resolution of findings seen on the previous
exam on later phases favors benign transient hepatic intensity
differences given lack of abnormality seen on today's exam. No
pericholecystic stranding. No biliary duct dilation.

Pancreas: Possible pancreatic divisum versus dominant dorsal duct
drainage through minor papilla, no sign of ductal dilation. No sign
of pancreatic inflammation. No focal, suspicious

Spleen: Mild splenic enlargement with small splenic lesion likely
splenic hemangioma not changed since previous imaging.

Adrenals/Urinary Tract: LEFT adrenal cyst. RIGHT adrenal is normal.
Bosniak category 2 cyst in the upper pole the LEFT kidney is similar
to the prior study. Small cysts in the lower pole the RIGHT kidney
are unchanged.

Stomach/Bowel: Limited assessment of gastrointestinal structures
without acute process to the extent evaluated.

Vascular/Lymphatic: Signs of atherosclerotic change of the abdominal
aorta. No aneurysmal dilation. There is no gastrohepatic or
hepatoduodenal ligament lymphadenopathy. No retroperitoneal or
mesenteric lymphadenopathy.

Other:  No ascites.

Musculoskeletal: No suspicious bone lesions identified.
IMPRESSION: 1. Signs of hepatic intensity differences of transient nature not
visualized compatible with benign findings.
2. Stable bilateral renal cysts.
3. LEFT adrenal cyst.
4. Possible pancreatic divisum versus dominant dorsal duct drainage
through minor papilla, no sign of ductal dilation. No pancreatic
inflammation.

Aortic Atherosclerosis (LILN2-VL8.8).

## 2022-01-23 DIAGNOSIS — E559 Vitamin D deficiency, unspecified: Secondary | ICD-10-CM | POA: Diagnosis not present

## 2022-01-23 DIAGNOSIS — N529 Male erectile dysfunction, unspecified: Secondary | ICD-10-CM | POA: Diagnosis not present

## 2022-01-23 DIAGNOSIS — E782 Mixed hyperlipidemia: Secondary | ICD-10-CM | POA: Diagnosis not present

## 2022-01-23 DIAGNOSIS — E538 Deficiency of other specified B group vitamins: Secondary | ICD-10-CM | POA: Diagnosis not present

## 2022-01-23 DIAGNOSIS — G4700 Insomnia, unspecified: Secondary | ICD-10-CM | POA: Diagnosis not present

## 2022-01-23 DIAGNOSIS — Z23 Encounter for immunization: Secondary | ICD-10-CM | POA: Diagnosis not present

## 2022-01-23 DIAGNOSIS — I10 Essential (primary) hypertension: Secondary | ICD-10-CM | POA: Diagnosis not present

## 2022-02-12 ENCOUNTER — Encounter: Payer: Self-pay | Admitting: Cardiology

## 2022-02-12 ENCOUNTER — Other Ambulatory Visit: Payer: Self-pay

## 2022-02-12 ENCOUNTER — Ambulatory Visit: Payer: BC Managed Care – PPO | Admitting: Cardiology

## 2022-02-12 VITALS — BP 127/75 | HR 67 | Resp 17 | Ht 70.0 in | Wt 192.4 lb

## 2022-02-12 DIAGNOSIS — I10 Essential (primary) hypertension: Secondary | ICD-10-CM | POA: Diagnosis not present

## 2022-02-12 DIAGNOSIS — I2510 Atherosclerotic heart disease of native coronary artery without angina pectoris: Secondary | ICD-10-CM

## 2022-02-12 DIAGNOSIS — E782 Mixed hyperlipidemia: Secondary | ICD-10-CM | POA: Diagnosis not present

## 2022-02-12 DIAGNOSIS — G720 Drug-induced myopathy: Secondary | ICD-10-CM | POA: Insufficient documentation

## 2022-02-12 DIAGNOSIS — T466X5A Adverse effect of antihyperlipidemic and antiarteriosclerotic drugs, initial encounter: Secondary | ICD-10-CM

## 2022-02-12 MED ORDER — NITROGLYCERIN 0.4 MG SL SUBL: 0.4000 mg | SUBLINGUAL_TABLET | SUBLINGUAL | 1 refills | Status: DC | PRN

## 2022-02-12 MED ORDER — BEMPEDOIC ACID 180 MG PO TABS: 180.0000 mg | ORAL_TABLET | Freq: Every day | ORAL | 2 refills | Status: DC

## 2022-02-12 NOTE — Progress Notes (Signed)
Follow up visit  Subjective:   [name], male    DOB: [birth year], 67 y.o.   MRN: [id number]   HPI   Chief Complaint  Patient presents with   Coronary Artery Disease   Follow-up    1 year    67 y.o. Caucasian male  with hypertension, hyperlipidemia, CAD (STEMI 01/2019) treated with primary PCI to pLAD.   Patient is doing well, denies chest pain, shortness of breath, palpitations, leg edema, orthopnea, PND, TIA/syncope. He has not tolerated 4 different statins due to myalgias, but has tolerated pravastatin 40 mg daily. Reviewed recent test results with the patient, details below.     Current Outpatient Medications:    acetaminophen (TYLENOL) 500 MG tablet, Take 500-1,000 mg by mouth every 6 (six) hours as needed (for headaches)., Disp: , Rfl:    amLODipine (NORVASC) 5 MG tablet, Take 1 tablet (5 mg total) by mouth daily., Disp: 30 tablet, Rfl: 1   aspirin EC 81 MG tablet, Take 81 mg by mouth daily., Disp: , Rfl:    Bempedoic Acid 180 MG TABS, Take 1 tablet (180 mg total) by mouth daily., Disp: 30 tablet, Rfl: 2   Cholecalciferol (VITAMIN D3) 50 MCG (2000 UT) TABS, Take 4,000 Units by mouth daily., Disp: , Rfl:    Multiple Vitamins-Minerals (PRESERVISION AREDS 2) CAPS, Take 1 capsule by mouth daily., Disp: , Rfl:    nitroGLYCERIN (NITROSTAT) 0.4 MG SL tablet, Place 1 tablet (0.4 mg total) under the tongue every 5 (five) minutes as needed for chest pain., Disp: 30 tablet, Rfl: 1   olmesartan (BENICAR) 40 MG tablet, Take 40 mg by mouth daily., Disp: , Rfl:    pravastatin (PRAVACHOL) 40 MG tablet, Take 40 mg by mouth daily., Disp: , Rfl:    sildenafil (REVATIO) 20 MG tablet, Take 40-100 mg by mouth daily as needed (ED)., Disp: , Rfl:    vitamin B-12 (CYANOCOBALAMIN) 1000 MCG tablet, 1 tablet, Disp: , Rfl:   Cardiovascular & other pertient studies:  EKG 02/12/2022: Sinus rhythm 63 bpm Old anteroseptal infarct  Lexiscan (Walking with mod Bruce) Sestamibi Stress Test  02/22/2019: Nondiagnostic ECG stress. Mod Bruce protocol. No chest pain. Moderate sized mid to distal anterolateral and apical infarct without ischemia. Mild diaphragmatic attenuation artifact.  Gated SPECT imaging of the left ventricle demonstrating akinesis of the mid to distal anterior and antero apical wall.  Stress LV EF is moderately dysfunctional 32%.  Intermediate risk. No previous exam available for comparison.  Echocardiogram 02/11/2019:  1. Normal size LV. Moderate hypokinesis mid to distal anteroseptal wall.  LVEF 45-50%. Grade 1 diastolic dysfunction.   2. Normal RV size and function.   3. Mild mitral leaflet thickening.   4. Mild tricuspid regurgitation. Estimated PASP 32 mmHg.   Coronary angiography and intervention 02/10/2019: STEMI in evolution LM: Normal LAD: Prox 100% occlusion. Mid 40% stenosis. Ramus: Mid 40% stenosis LCx: OM2 lateral branch with focal 40% stenosis. RCA: Minimal luminal irregularities.    Successful percutaneous coronary intervention pLAD Mechanical thrombectomy, PTCA and stent placement Resolute 3.5 X 18 mm Residual mild moderate disease best treated medically.     Recent labs: 01/23/2022: Glucose 90, BUN/Cr 17/1.1. EGFR 74. K 4.7. Hb 14 HbA1C 5.7% Chol 150, TG 109, HDL 31, LDL 99 TSH 1.4 normal  07/04/2020: Glucose 98, BUN/Cr 26/1.07. EGFR >60. Na/K 139/4.0.  H/H 14/43. MCV 94. Platelets 299  02/2020: Chol 114, TG 73, HDL 36, LDL 63    Review of Systems  Cardiovascular:  Negative for chest pain, dyspnea on exertion, leg swelling, palpitations and syncope.  Respiratory:  Negative for shortness of breath.         Vitals:   02/12/22 1408  BP: 127/75  Pulse: 67  Resp: 17  SpO2: 98%     Body mass index is 27.61 kg/m. Filed Weights   02/12/22 1408  Weight: 192 lb 6.4 oz (87.3 kg)     Objective:   Physical Exam Vitals and nursing note reviewed.  Constitutional:      Appearance: He is well-developed.  Neck:      Vascular: No JVD.  Cardiovascular:     Rate and Rhythm: Normal rate and regular rhythm.     Pulses: Intact distal pulses.     Heart sounds: Normal heart sounds. No murmur heard. Pulmonary:     Effort: Pulmonary effort is normal.     Breath sounds: Normal breath sounds. No wheezing or rales.  Musculoskeletal:     Right lower leg: No edema.     Left lower leg: No edema.           Assessment & Recommendations:   67 y.o. Caucasian male  with hypertension, hyperlipidemia, CAD (STEMI 01/2019) treated with primary PCI to pLAD.   CAD without angina: STEMI 01/2019 treated with primary PCI to proximal LAD. Residual moderate stenosis in OM2 and mid LAD, without any ischemia on stress test (02/2019). Old anterolateral infarct seen., Continue aspirin Not on beta blockers given 3 years post MI. See below re: lipid management  Hypertension: Controlled  Mixed hyperlipidemia: Chol 150, TG 109, HDL 31, LDL 99 (01/2022) Currently on pravastatin 40 mg daily. Statin myalgias with all other statins.  Given that LDL is now at goal (<70 or even <55, if possible), I have added Nexlitol 180 mg daily. He has upcoming labs with PCP in 4 months where lipid panel can be checked.   F/u in 1 year   Nigel Mormon, MD Pager: [phone] Office: [phone]

## 2022-02-13 ENCOUNTER — Other Ambulatory Visit: Payer: Self-pay

## 2022-02-13 DIAGNOSIS — I2510 Atherosclerotic heart disease of native coronary artery without angina pectoris: Secondary | ICD-10-CM

## 2022-02-13 DIAGNOSIS — E782 Mixed hyperlipidemia: Secondary | ICD-10-CM

## 2022-02-13 MED ORDER — BEMPEDOIC ACID 180 MG PO TABS: 180.0000 mg | ORAL_TABLET | Freq: Every day | ORAL | 2 refills | Status: AC

## 2022-02-13 MED ORDER — AMLODIPINE BESYLATE 5 MG PO TABS: 5.0000 mg | ORAL_TABLET | Freq: Every day | ORAL | 1 refills | Status: AC

## 2022-02-18 DIAGNOSIS — J069 Acute upper respiratory infection, unspecified: Secondary | ICD-10-CM | POA: Diagnosis not present

## 2022-02-18 DIAGNOSIS — R0981 Nasal congestion: Secondary | ICD-10-CM | POA: Diagnosis not present

## 2022-02-18 DIAGNOSIS — R509 Fever, unspecified: Secondary | ICD-10-CM | POA: Diagnosis not present

## 2022-02-18 DIAGNOSIS — Z03818 Encounter for observation for suspected exposure to other biological agents ruled out: Secondary | ICD-10-CM | POA: Diagnosis not present

## 2022-02-18 DIAGNOSIS — J439 Emphysema, unspecified: Secondary | ICD-10-CM | POA: Diagnosis not present

## 2022-03-26 DIAGNOSIS — D3131 Benign neoplasm of right choroid: Secondary | ICD-10-CM | POA: Diagnosis not present

## 2022-03-26 DIAGNOSIS — H18513 Endothelial corneal dystrophy, bilateral: Secondary | ICD-10-CM | POA: Diagnosis not present

## 2022-03-26 DIAGNOSIS — H25813 Combined forms of age-related cataract, bilateral: Secondary | ICD-10-CM | POA: Diagnosis not present

## 2022-03-26 DIAGNOSIS — H524 Presbyopia: Secondary | ICD-10-CM | POA: Diagnosis not present

## 2022-04-28 DIAGNOSIS — R058 Other specified cough: Secondary | ICD-10-CM | POA: Diagnosis not present

## 2022-04-28 DIAGNOSIS — J0100 Acute maxillary sinusitis, unspecified: Secondary | ICD-10-CM | POA: Diagnosis not present

## 2022-08-03 ENCOUNTER — Other Ambulatory Visit: Payer: Self-pay | Admitting: Family Medicine

## 2022-08-03 DIAGNOSIS — Z122 Encounter for screening for malignant neoplasm of respiratory organs: Secondary | ICD-10-CM

## 2022-08-03 DIAGNOSIS — Z0000 Encounter for general adult medical examination without abnormal findings: Secondary | ICD-10-CM | POA: Diagnosis not present

## 2022-08-03 DIAGNOSIS — N529 Male erectile dysfunction, unspecified: Secondary | ICD-10-CM | POA: Diagnosis not present

## 2022-08-03 DIAGNOSIS — Z5181 Encounter for therapeutic drug level monitoring: Secondary | ICD-10-CM | POA: Diagnosis not present

## 2022-08-03 DIAGNOSIS — E782 Mixed hyperlipidemia: Secondary | ICD-10-CM | POA: Diagnosis not present

## 2022-08-03 DIAGNOSIS — I10 Essential (primary) hypertension: Secondary | ICD-10-CM | POA: Diagnosis not present

## 2022-08-03 DIAGNOSIS — Z125 Encounter for screening for malignant neoplasm of prostate: Secondary | ICD-10-CM | POA: Diagnosis not present

## 2022-08-26 ENCOUNTER — Inpatient Hospital Stay: Admission: RE | Admit: 2022-08-26 | Payer: 59 | Source: Ambulatory Visit

## 2023-02-12 ENCOUNTER — Ambulatory Visit: Payer: Self-pay | Admitting: Cardiology

## 2023-02-17 ENCOUNTER — Encounter: Payer: Self-pay | Admitting: *Deleted

## 2023-04-02 DIAGNOSIS — E782 Mixed hyperlipidemia: Secondary | ICD-10-CM | POA: Diagnosis not present

## 2023-04-02 DIAGNOSIS — E538 Deficiency of other specified B group vitamins: Secondary | ICD-10-CM | POA: Diagnosis not present

## 2023-04-02 DIAGNOSIS — E559 Vitamin D deficiency, unspecified: Secondary | ICD-10-CM | POA: Diagnosis not present

## 2023-04-02 DIAGNOSIS — I10 Essential (primary) hypertension: Secondary | ICD-10-CM | POA: Diagnosis not present

## 2023-04-02 DIAGNOSIS — K219 Gastro-esophageal reflux disease without esophagitis: Secondary | ICD-10-CM | POA: Diagnosis not present

## 2023-04-02 DIAGNOSIS — N529 Male erectile dysfunction, unspecified: Secondary | ICD-10-CM | POA: Diagnosis not present

## 2023-04-13 ENCOUNTER — Other Ambulatory Visit: Payer: Self-pay | Admitting: *Deleted

## 2023-04-13 DIAGNOSIS — Z87891 Personal history of nicotine dependence: Secondary | ICD-10-CM

## 2023-04-13 DIAGNOSIS — Z122 Encounter for screening for malignant neoplasm of respiratory organs: Secondary | ICD-10-CM

## 2023-04-30 ENCOUNTER — Ambulatory Visit
Admission: RE | Admit: 2023-04-30 | Discharge: 2023-04-30 | Disposition: A | Discharge status: Home / Self Care | Payer: Self-pay | Source: Ambulatory Visit | Attending: Acute Care | Admitting: Acute Care

## 2023-04-30 DIAGNOSIS — Z122 Encounter for screening for malignant neoplasm of respiratory organs: Secondary | ICD-10-CM | POA: Diagnosis not present

## 2023-04-30 DIAGNOSIS — Z87891 Personal history of nicotine dependence: Secondary | ICD-10-CM

## 2023-05-10 DIAGNOSIS — L820 Inflamed seborrheic keratosis: Secondary | ICD-10-CM | POA: Diagnosis not present

## 2023-05-24 ENCOUNTER — Encounter: Payer: Self-pay | Admitting: Cardiology

## 2023-05-24 ENCOUNTER — Ambulatory Visit: Payer: Self-pay | Attending: Cardiology | Admitting: Cardiology

## 2023-05-24 VITALS — BP 130/86 | HR 56 | Ht 70.0 in | Wt 206.1 lb

## 2023-05-24 DIAGNOSIS — R0609 Other forms of dyspnea: Secondary | ICD-10-CM | POA: Insufficient documentation

## 2023-05-24 DIAGNOSIS — R600 Localized edema: Secondary | ICD-10-CM | POA: Diagnosis not present

## 2023-05-24 DIAGNOSIS — I2510 Atherosclerotic heart disease of native coronary artery without angina pectoris: Secondary | ICD-10-CM

## 2023-05-24 DIAGNOSIS — E782 Mixed hyperlipidemia: Secondary | ICD-10-CM | POA: Diagnosis not present

## 2023-05-24 DIAGNOSIS — I10 Essential (primary) hypertension: Secondary | ICD-10-CM

## 2023-05-24 MED ORDER — FUROSEMIDE 40 MG PO TABS: 40.0000 mg | ORAL_TABLET | Freq: Every day | ORAL | 3 refills | Status: DC

## 2023-05-24 NOTE — Progress Notes (Signed)
  Cardiology Office Note:  .   Date:  05/24/2023  ID:  [name], DOB 1955/05/22, MRN [id number] PCP: Rae Bugler, MD  [facility] Providers Cardiologist:  Fransico Ivy, MD PCP: Rae Bugler, MD  Chief Complaint  Patient presents with   Leg Swelling   Shortness of Breath          [name] is a 68 y.o. male with hypertension, hyperlipidemia, CAD  History of Present Illness  Patient has recently noticed leg swelling, as well as exertional dyspnea.  He denies any chest pain.  He is also noted some distention of his abdomen.  He does endorse eating outside on occasions, does not regularly watch her salt intake.  He asks if it is time to check his coronary arteries again.     Vitals:   05/24/23 0904  BP: 130/86  Pulse: (!) 56  SpO2: 95%      Review of Systems  Cardiovascular:  Positive for dyspnea on exertion and leg swelling. Negative for chest pain, palpitations and syncope.  Genitourinary:        Abdominal distention        Studies Reviewed: [name]        EKG 05/24/2023: Sinus bradycardia Septal infarct (cited on or before 24-May-2023) When compared with ECG of 11-Feb-2019 06:19, QRS duration has increased   Independently interpreted 03/2023: Chol 131, TG 226, HDL 36, LDL 59 K 4.2  07/2022: Hb 14.5 TSH 1.2    Physical Exam Vitals and nursing note reviewed.  Constitutional:      General: He is not in acute distress. Neck:     Vascular: No JVD.  Cardiovascular:     Rate and Rhythm: Normal rate and regular rhythm.     Heart sounds: Normal heart sounds. No murmur heard. Pulmonary:     Effort: Pulmonary effort is normal.     Breath sounds: Normal breath sounds. No wheezing or rales.  Abdominal:     General: There is distension.  Musculoskeletal:     Right lower leg: Edema (1+) present.     Left lower leg: Edema (1+) present.      VISIT DIAGNOSES:   ICD-10-CM   1. Coronary artery disease involving native coronary artery of  native heart without angina pectoris  I25.10 EKG 12-Lead       [name] is a 68 y.o. male with hypertension, hyperlipidemia, CAD  Assessment & Plan  Exertional dyspnea, leg edema: Suspicious for mild fluid retention. Check proBNP, CMP, echocardiogram. Discussed low-salt diet. Started Lasix 40 mg daily. Symptoms do not improve, on/or echocardiogram shows any new wall motion abnormality different from that noted in 2021, he may need further ischemic evaluation with stress testing.  CAD: STEMI 2021 treated with primary PCI to pLAD.  Rest mild to moderate disease. Continue aspirin  81 mg daily, pravastatin  40 mg daily, bempedoic acid  180 mg daily.  Lipids very well-controlled.  Hypertension: Blood pressure well-controlled on metoprolol  succinate alone. Okay to continue the same.      Meds ordered this encounter  Medications   furosemide (LASIX) 40 MG tablet    Sig: Take 1 tablet (40 mg total) by mouth daily.    Dispense:  90 tablet    Refill:  3     F/u in 6-8 weeks  Signed, Cody Das, MD

## 2023-05-24 NOTE — Patient Instructions (Signed)
 Medication Instructions:  START Lasix 40 mg daily   *If you need a refill on your cardiac medications before your next appointment, please call your pharmacy*  Lab Work: Cmp probnp  If you have labs (blood work) drawn today and your tests are completely normal, you will receive your results only by: MyChart Message (if you have MyChart) OR A paper copy in the mail If you have any lab test that is abnormal or we need to change your treatment, we will call you to review the results.  Testing/Procedures: Echo  Your physician has requested that you have an echocardiogram. Echocardiography is a painless test that uses sound waves to create images of your heart. It provides your doctor with information about the size and shape of your heart and how well your heart's chambers and valves are working. This procedure takes approximately one hour. There are no restrictions for this procedure. Please do NOT wear cologne, perfume, aftershave, or lotions (deodorant is allowed). Please arrive 15 minutes prior to your appointment time.  Please note: We ask at that you not bring children with you during ultrasound (echo/ vascular) testing. Due to room size and safety concerns, children are not allowed in the ultrasound rooms during exams. Our front office staff cannot provide observation of children in our lobby area while testing is being conducted. An adult accompanying a patient to their appointment will only be allowed in the ultrasound room at the discretion of the ultrasound technician under special circumstances. We apologize for any inconvenience.   Follow-Up: At [facility], you and your health needs are our priority.  As part of our continuing mission to provide you with exceptional heart care, our providers are all part of one team.  This team includes your primary Cardiologist (physician) and Advanced Practice Providers or APPs (Physician Assistants and Nurse Practitioners) who all work  together to provide you with the care you need, when you need it.  Your next appointment:   6-8 week(s)  Provider:   One of our Advanced Practice Providers (APPs): Melita Springer, PA-C  Friddie Jetty, NP Evaline Hill, NP  Theotis Flake, PA-C Lawana Pray, NP  Willis Harter, PA-C Lovette Rud, PA-C  [place], [place] Charles Connor, NP  Marlana Silvan, NP Marcie Sever, PA-C  Laquita Plant, PA-C    Dayna Dunn, PA-C  Marlyse Single, PA-C Palmer Bobo, NP Katlyn West, NP Callie Goodrich, PA-C  Evan Williams, PA-C Sheng Haley, PA-C  Xika Zhao, NP Kathleen Johnson, PA-C

## 2023-05-25 ENCOUNTER — Ambulatory Visit: Payer: Self-pay | Admitting: Cardiology

## 2023-05-25 LAB — COMPREHENSIVE METABOLIC PANEL WITH GFR
ALT: 19 IU/L (ref 0–44)
AST: 11 IU/L (ref 0–40)
Albumin: 4.3 g/dL (ref 3.9–4.9)
Alkaline Phosphatase: 51 IU/L (ref 44–121)
BUN/Creatinine Ratio: 16 (ref 10–24)
BUN: 19 mg/dL (ref 8–27)
Bilirubin Total: 0.5 mg/dL (ref 0.0–1.2)
CO2: 20 mmol/L (ref 20–29)
Calcium: 9.7 mg/dL (ref 8.6–10.2)
Chloride: 106 mmol/L (ref 96–106)
Creatinine, Ser: 1.18 mg/dL (ref 0.76–1.27)
Globulin, Total: 2 g/dL (ref 1.5–4.5)
Glucose: 97 mg/dL (ref 70–99)
Potassium: 5 mmol/L (ref 3.5–5.2)
Sodium: 142 mmol/L (ref 134–144)
Total Protein: 6.3 g/dL (ref 6.0–8.5)
eGFR: 68 mL/min/{1.73_m2} (ref >59)

## 2023-05-25 LAB — PRO B NATRIURETIC PEPTIDE: NT-Pro BNP: 104 pg/mL (ref 0–376)

## 2023-05-26 ENCOUNTER — Other Ambulatory Visit: Payer: Self-pay

## 2023-05-26 DIAGNOSIS — Z122 Encounter for screening for malignant neoplasm of respiratory organs: Secondary | ICD-10-CM

## 2023-05-26 DIAGNOSIS — Z87891 Personal history of nicotine dependence: Secondary | ICD-10-CM

## 2023-05-31 NOTE — Telephone Encounter (Signed)
 Coronary atherosclerosis noted on CT scan is not surprising given that we already for aware of plaque build up in your heart arteries.  I want to reassure you that there is no suggestion of acute finding on the CT scan, although it is not specifically geared to assess severity of coronary artery disease.  Brooke, please check if echocardiogram can be done sooner than 06/25/2023.  That said, lab results do not suggest florid congestive heart failure, which is reassuring.  Thanks MJP

## 2023-05-31 NOTE — Telephone Encounter (Signed)
 Left message to call back

## 2023-06-03 DIAGNOSIS — H18513 Endothelial corneal dystrophy, bilateral: Secondary | ICD-10-CM | POA: Diagnosis not present

## 2023-06-03 DIAGNOSIS — H353221 Exudative age-related macular degeneration, left eye, with active choroidal neovascularization: Secondary | ICD-10-CM | POA: Diagnosis not present

## 2023-06-03 DIAGNOSIS — D3131 Benign neoplasm of right choroid: Secondary | ICD-10-CM | POA: Diagnosis not present

## 2023-06-03 DIAGNOSIS — H25813 Combined forms of age-related cataract, bilateral: Secondary | ICD-10-CM | POA: Diagnosis not present

## 2023-06-09 ENCOUNTER — Encounter (INDEPENDENT_AMBULATORY_CARE_PROVIDER_SITE_OTHER): Payer: Self-pay | Admitting: Ophthalmology

## 2023-06-11 ENCOUNTER — Encounter (INDEPENDENT_AMBULATORY_CARE_PROVIDER_SITE_OTHER): Payer: Self-pay | Admitting: Ophthalmology

## 2023-06-11 ENCOUNTER — Ambulatory Visit (INDEPENDENT_AMBULATORY_CARE_PROVIDER_SITE_OTHER): Admitting: Ophthalmology

## 2023-06-11 DIAGNOSIS — H353111 Nonexudative age-related macular degeneration, right eye, early dry stage: Secondary | ICD-10-CM | POA: Diagnosis not present

## 2023-06-11 DIAGNOSIS — D3131 Benign neoplasm of right choroid: Secondary | ICD-10-CM

## 2023-06-11 DIAGNOSIS — H353221 Exudative age-related macular degeneration, left eye, with active choroidal neovascularization: Secondary | ICD-10-CM

## 2023-06-11 DIAGNOSIS — H35033 Hypertensive retinopathy, bilateral: Secondary | ICD-10-CM | POA: Diagnosis not present

## 2023-06-11 DIAGNOSIS — H25813 Combined forms of age-related cataract, bilateral: Secondary | ICD-10-CM

## 2023-06-11 DIAGNOSIS — I10 Essential (primary) hypertension: Secondary | ICD-10-CM | POA: Diagnosis not present

## 2023-06-11 MED ORDER — BEVACIZUMAB CHEMO INJECTION 1.25MG/0.05ML SYRINGE FOR KALEIDOSCOPE
1.2500 mg | INTRAVITREAL | Status: AC | PRN
  Administered 2023-06-11: 1.25 mg via INTRAVITREAL

## 2023-06-11 NOTE — Progress Notes (Signed)
 [facility] - Clinic Note  06/11/2023   CHIEF COMPLAINT Patient presents for Retina Evaluation  HISTORY OF PRESENT ILLNESS: [name] is a 68 y.o. male who presents to the clinic today for:  HPI     Retina Evaluation   In both eyes.  This started 1 week ago.  Duration of 1 week.  Context:  distance vision, mid-range vision and near vision.  I, the attending physician,  performed the HPI with the patient and updated documentation appropriately.        Comments   Retina eval per Dr Carloyn Chi for new lesion SRS OS pt is reporting vision has not been as sharp he denies any flashes or floaters       Last edited by Ronelle Coffee, MD on 06/11/2023 10:15 AM.    Pt is here on the referral of Dr. Carloyn Chi for concern of exu ARMD OS, pt saw her on May 32th, but couldn't get here until today, pt states he's seen Dr. Carloyn Chi for several years and has known he has dry macular degeneration, but she recently discovered that his left eye has converted to wet, he states he can tell the vision in his left eye has decreased over the past 2 months   Referring physician: Florance Hun, MD [address] [place],  [place] [address]  HISTORICAL INFORMATION:  Selected notes from the MEDICAL RECORD NUMBER Referred by Dr. Carloyn Chi for concern of SRF OS [name]:  Ocular Hx- PMH-   CURRENT MEDICATIONS: No current outpatient medications on file. (Ophthalmic Drugs)   No current facility-administered medications for this visit. (Ophthalmic Drugs)   Current Outpatient Medications (Other)  Medication Sig   acetaminophen  (TYLENOL ) 500 MG tablet Take 500-1,000 mg by mouth every 6 (six) hours as needed (for headaches).   amLODipine  (NORVASC ) 5 MG tablet Take 1 tablet (5 mg total) by mouth daily.   aspirin  EC 81 MG tablet Take 81 mg by mouth daily.   Bempedoic Acid  180 MG TABS Take 1 tablet (180 mg total) by mouth daily.   Cholecalciferol (VITAMIN D3) 50 MCG (2000 UT) TABS Take 4,000 Units by mouth  daily.   furosemide  (LASIX ) 40 MG tablet Take 1 tablet (40 mg total) by mouth daily.   Multiple Vitamins-Minerals (PRESERVISION AREDS 2) CAPS Take 1 capsule by mouth daily.   nitroGLYCERIN  (NITROSTAT ) 0.4 MG SL tablet Place 1 tablet (0.4 mg total) under the tongue every 5 (five) minutes as needed for chest pain.   olmesartan (BENICAR) 40 MG tablet Take 40 mg by mouth daily.   pravastatin  (PRAVACHOL ) 40 MG tablet Take 40 mg by mouth daily.   sildenafil (REVATIO) 20 MG tablet Take 40-100 mg by mouth daily as needed (ED).   vitamin B-12 (CYANOCOBALAMIN ) 1000 MCG tablet 1 tablet   No current facility-administered medications for this visit. (Other)   REVIEW OF SYSTEMS: ROS   Positive for: Gastrointestinal, Cardiovascular, Eyes, Allergic/Imm Last edited by Alise Appl, COT on 06/11/2023  8:31 AM.     ALLERGIES Allergies  Allergen Reactions   Ceclor [Cefaclor] Other (See Comments)    Reaction not recalled by the patient   Crestor  [Rosuvastatin ] Other (See Comments)    Muscle aches/pains   Lipitor [Atorvastatin ]     Muscle aches/pains   Zetia [Ezetimibe] Other (See Comments)    Muscle aches/pains   PAST MEDICAL HISTORY Past Medical History:  Diagnosis Date   Cancer (HCC)    Melanoma and Basal Cell Carcinoma  History of kidney stones    Hyperlipidemia    Hypertension    Myocardial infarction (HCC)    Nephrolithiasis    Pneumonia    Psoriasis    Tobacco abuse    Past Surgical History:  Procedure Laterality Date   APPENDECTOMY  1969   COLONOSCOPY     CORONARY THROMBECTOMY N/A 02/10/2019   Procedure: Coronary Thrombectomy;  Surgeon: Cody Das, MD;  Location: [facility]VASIVE [facility];  Service: Cardiovascular;  Laterality: N/A;   CORONARY ULTRASOUND/IVUS N/A 02/10/2019   Procedure: Intravascular Ultrasound/IVUS;  Surgeon: Cody Das, MD;  Location: [facility]AS[facility];  Service: Cardiovascular;  Laterality: N/A;   CORONARY/GRAFT ACUTE MI  REVASCULARIZATION N/A 02/10/2019   Procedure: Coronary/Graft Acute MI Revascularization;  Surgeon: Cody Das, MD;  Location: [facility];  Service: Cardiovascular;  Laterality: N/A;   INGUINAL HERNIA REPAIR Bilateral 07/10/2020   Procedure: LAPAROSCOPIC BILATERAL INGUINAL HERNIA REPAIR;  Surgeon: Shela Derby, MD;  Location[facility];  Service: General;  Laterality: Bilateral;   INSERTION OF MESH Bilateral 07/10/2020   Procedure: INSERTION OF MESH;  Surgeon: Shela Derby, MD;  Location: [facility] OR;  Service: General;  Laterality: Bilateral;   LEFT HEART CATH AND CORONARY ANGIOGRAPHY N/A 02/10/2019   Procedure: LEFT HEART CATH AND CORONARY ANGIOGRAPHY;  Surgeon: Cody Das, MD;  Location: [facility];  Service: Cardiovascular;  Laterality: N/A;   TONSILLECTOMY  1966   FAMILY HISTORY Family History  Problem Relation Age of Onset   Stroke Mother    Hypertension Mother    Hyperlipidemia Mother    Heart attack Father    Heart disease Father    Hypertension Father    Hyperlipidemia Father    Breast cancer Sister    Liver cancer Sister    Bone cancer Sister    Hypertension Brother    SOCIAL HISTORY Social History   Tobacco Use   Smoking status: Former    Current packs/day: 0.00    Average packs/day: 1 pack/day for 43.0 years (43.0 ttl pk-yrs)    Types: Cigarettes    Start date: 02/10/1976    Quit date: 02/10/2019    Years since quitting: 4.3   Smokeless tobacco: Never  Vaping Use   Vaping status: Never Used  Substance Use Topics   Alcohol use: Yes    Comment: social   Drug use: No       OPHTHALMIC EXAM:  Base Eye Exam     Visual Acuity (Snellen - Linear)       Right Left   Dist cc 20/25 -1 20/60 -3   Dist ph cc 20/20 NI    Correction: Glasses         Tonometry (Tonopen, 8:41 AM)       Right Left   Pressure 11 12         Pupils       Pupils Dark Light Shape React APD   Right PERRL 3 2 Round Sluggish None   Left PERRL 3 2  Round Sluggish None         Visual Fields       Left Right    Full Full         Extraocular Movement       Right Left    Full, Ortho Full, Ortho         Neuro/Psych     Oriented x3: Yes   Mood/Affect: Normal  Dilation     Both eyes: 2.5% Phenylephrine @ 8:41 AM           Slit Lamp and Fundus Exam     Slit Lamp Exam       Right Left   Lids/Lashes Dermatochalasis - upper lid Dermatochalasis - upper lid   Conjunctiva/Sclera White and quiet White and quiet   Cornea trace PEE tear film debris   Anterior Chamber deep and clear deep and clear   Iris Round and dilated Round and dilated   Lens 2+ Nuclear sclerosis, 2+ Cortical cataract 2+ Nuclear sclerosis, 2+ Cortical cataract         Fundus Exam       Right Left   Disc Pink and Sharp, Compact Pink and Sharp, Compact   C/D Ratio 0.2 0.2   Macula Flat, Good foveal reflex, Drusen, RPE mottling and clumping, pigmented choroidal lesion just inside ST arcades (1.5Vx1.0H) -- flat, no SRF or orange pigment Flat, Blunted foveal reflex, central CNV with +SRF / edema, no heme   Vessels mild attenuation, mild tortuosity attenuated, Tortuous   Periphery Attached, No heme Attached, No heme           Refraction     Wearing Rx       Sphere Cylinder Axis Add   Right -0.50 +0.50 015 +2.25   Left -1.75 +2.25 063 +2.25         Manifest Refraction   Unable to correct os any better           IMAGING AND PROCEDURES  Imaging and Procedures for 06/11/2023  OCT, Retina - OU - Both Eyes        Right Eye Quality was good. Central Foveal Thickness: 343. Progression has no prior data. Findings include normal foveal contour, no IRF, no SRF, retinal drusen , vitreomacular adhesion (Focal hyper reflective choroidal lesion ST mac seen best on widefield).   Left Eye Quality was good. Central Foveal Thickness: 406. Progression has no prior data. Findings include no IRF, abnormal foveal contour, retinal  drusen , choroidal neovascular membrane, pigment epithelial detachment, subretinal fluid (Central CNV / PED with SRF overlying).   Notes  *Images captured and stored on drive  Diagnosis / Impression:  OD: nonexudative ARMD; Focal hyper reflective choroidal lesion ST mac seen best on widefield OS: Central CNV / PED with SRF overlying -- exudative ARMD  Clinical management:  See below  Abbreviations: NFP - Normal foveal profile. CME - cystoid macular edema. PED - pigment epithelial detachment. IRF - intraretinal fluid. SRF - subretinal fluid. EZ - ellipsoid zone. ERM - epiretinal membrane. ORA - outer retinal atrophy. ORT - outer retinal tubulation. SRHM - subretinal hyper-reflective material. IRHM - intraretinal hyper-reflective material      Intravitreal Injection, Pharmacologic Agent - OS - Left Eye       Time Out 06/11/2023. 9:27 AM. Confirmed correct patient, procedure, site, and patient consented.   Anesthesia Topical anesthesia was used. Anesthetic medications included Lidocaine  2%, Proparacaine 0.5%.   Procedure Preparation included 5% betadine to ocular surface, eyelid speculum. A supplied needle was used.   Injection: 1.25 mg Bevacizumab 1.25mg /0.89ml   Route: Intravitreal, Site: Left Eye   NDC: [id number], Lot: [id number], Expiration date: 07/02/2023   Post-op Post injection exam found visual acuity of at least counting fingers. The patient tolerated the procedure well. There were no complications. The patient received written and verbal post procedure care education.  ASSESSMENT/PLAN:   ICD-10-CM   1. Exudative age-related macular degeneration of left eye with active choroidal neovascularization (HCC)  H35.3221 OCT, Retina - OU - Both Eyes    Intravitreal Injection, Pharmacologic Agent - OS - Left Eye    Bevacizumab  (AVASTIN ) SOLN 1.25 mg    2. Early dry stage nonexudative age-related macular degeneration of right eye  H35.3111     3. Essential  hypertension  I10     4. Hypertensive retinopathy of both eyes  H35.033     5. Nevus of choroid of right eye  D31.31     6. Combined forms of age-related cataract of both eyes  H25.813      1. Exudative age related macular degeneration, OS  - pt reports several week history of decreased vision OS  - The incidence pathology and anatomy of wet AMD discussed   - discussed treatment options including observation vs intravitreal anti-VEGF agents such as Avastin , Lucentis, Eylea.    - Risks of endophthalmitis and vascular occlusive events and atrophic changes discussed with patient  - BCVA 20/60  - OCT shows central CNV / PED with SRF overlying  - recommend IVA OS #1 today, 05.30.25  - pt wishes to be treated with IVA  - RBA of procedure discussed, questions answered - informed consent obtained and signed - see procedure note  - f/u in 4 wks, DFE, OCT  2. Age related macular degeneration, non-exudative, right eye  - The incidence, anatomy, and pathology of dry AMD, risk of progression, and the AREDS and AREDS 2 study including smoking risks discussed with patient.  - Recommend amsler grid monitoring  - monitor  3,4. Hypertensive retinopathy OU - discussed importance of tight BP control - monitor  5. Choroidal Nevus, OD  - 1.5 x 1DD, flat, pigmented lesion just inside ST arcades  - no visual symptoms, SRF or orange pigment  - no drusen  - thickness < 2mm  - discussed findings, prognosis  - monitor  6. Mixed Cataract OU - The symptoms of cataract, surgical options, and treatments and risks were discussed with patient. - discussed diagnosis and progression - monitor  Ophthalmic Meds Ordered this visit:  Meds ordered this encounter  Medications   Bevacizumab  (AVASTIN ) SOLN 1.25 mg     Return in about 4 weeks (around 07/09/2023) for f/u exu ARMD OS, DFE, OCT, Possible Injxn.  There are no Patient Instructions on file for this visit.  Explained the diagnoses, plan, and  follow up with the patient and they expressed understanding.  Patient expressed understanding of the importance of proper follow up care.   This document serves as a record of services personally performed by Jeanice Millard, MD, PhD. It was created on their behalf by Morley Arabia. Bevin Bucks, OA an ophthalmic technician. The creation of this record is the provider's dictation and/or activities during the visit.    Electronically signed by: Morley Arabia. Bevin Bucks, OA 06/11/23 11:39 AM  Jeanice Millard, M.D., Ph.D. Diseases & Surgery of the Retina and Vitreous [facility] 06/11/2023  I have reviewed the above documentation for accuracy and completeness, and I agree with the above. Jeanice Millard, M.D., Ph.D. 06/11/23 11:42 AM   Abbreviations: M myopia (nearsighted); A astigmatism; H hyperopia (farsighted); P presbyopia; Mrx spectacle prescription;  CTL contact lenses; OD right eye; OS left eye; OU both eyes  XT exotropia; ET esotropia; PEK punctate epithelial keratitis; PEE punctate epithelial erosions; DES dry eye syndrome; MGD meibomian  gland dysfunction; ATs artificial tears; PFAT's preservative free artificial tears; NSC nuclear sclerotic cataract; PSC posterior subcapsular cataract; ERM epi-retinal membrane; PVD posterior vitreous detachment; RD retinal detachment; DM diabetes mellitus; DR diabetic retinopathy; NPDR non-proliferative diabetic retinopathy; PDR proliferative diabetic retinopathy; CSME clinically significant macular edema; DME diabetic macular edema; dbh dot blot hemorrhages; CWS cotton wool spot; POAG primary open angle glaucoma; C/D cup-to-disc ratio; HVF humphrey visual field; GVF goldmann visual field; OCT optical coherence tomography; IOP intraocular pressure; BRVO Branch retinal vein occlusion; CRVO central retinal vein occlusion; CRAO central retinal artery occlusion; BRAO branch retinal artery occlusion; RT retinal tear; SB scleral buckle; PPV pars plana vitrectomy; VH  Vitreous hemorrhage; PRP panretinal laser photocoagulation; IVK intravitreal kenalog; VMT vitreomacular traction; MH Macular hole;  NVD neovascularization of the disc; NVE neovascularization elsewhere; AREDS age related eye disease study; ARMD age related macular degeneration; POAG primary open angle glaucoma; EBMD epithelial/anterior basement membrane dystrophy; ACIOL anterior chamber intraocular lens; IOL intraocular lens; PCIOL posterior chamber intraocular lens; Phaco/IOL phacoemulsification with intraocular lens placement; PRK photorefractive keratectomy; LASIK laser assisted in situ keratomileusis; HTN hypertension; DM diabetes mellitus; COPD chronic obstructive pulmonary disease

## 2023-06-22 ENCOUNTER — Ambulatory Visit (HOSPITAL_BASED_OUTPATIENT_CLINIC_OR_DEPARTMENT_OTHER)

## 2023-06-22 DIAGNOSIS — R600 Localized edema: Secondary | ICD-10-CM | POA: Diagnosis not present

## 2023-06-22 DIAGNOSIS — I2510 Atherosclerotic heart disease of native coronary artery without angina pectoris: Secondary | ICD-10-CM

## 2023-06-22 DIAGNOSIS — R0609 Other forms of dyspnea: Secondary | ICD-10-CM

## 2023-06-22 LAB — ECHOCARDIOGRAM COMPLETE
Area-P 1/2: 3.17 cm2
S' Lateral: 2.11 cm

## 2023-06-25 ENCOUNTER — Other Ambulatory Visit (HOSPITAL_COMMUNITY)

## 2023-06-25 DIAGNOSIS — L820 Inflamed seborrheic keratosis: Secondary | ICD-10-CM | POA: Diagnosis not present

## 2023-06-25 DIAGNOSIS — D225 Melanocytic nevi of trunk: Secondary | ICD-10-CM | POA: Diagnosis not present

## 2023-06-25 DIAGNOSIS — L570 Actinic keratosis: Secondary | ICD-10-CM | POA: Diagnosis not present

## 2023-06-25 DIAGNOSIS — L814 Other melanin hyperpigmentation: Secondary | ICD-10-CM | POA: Diagnosis not present

## 2023-06-30 ENCOUNTER — Other Ambulatory Visit (HOSPITAL_COMMUNITY)

## 2023-07-05 ENCOUNTER — Ambulatory Visit: Admitting: Cardiology

## 2023-07-07 NOTE — Progress Notes (Signed)
 [facility] - Clinic Note  07/14/2023   CHIEF COMPLAINT Patient presents for Retina Follow Up  HISTORY OF PRESENT ILLNESS: [name] is a 68 y.o. male who presents to the clinic today for:  HPI     Retina Follow Up   Patient presents with  Wet AMD.  In left eye.  This started 5 weeks ago.  Duration of 5 weeks.  Since onset it is stable.  I, the attending physician,  performed the HPI with the patient and updated documentation appropriately.        Comments   5 week retina follow up ARMD OS and IVA OS pt is reporting no vision changes noticed he denies any flashes or floaters       Last edited by Kevin Rogue, MD on 07/14/2023  3:47 PM.    Pt states   Referring physician: [name], MD [phone] [name] [address] [place],  [place] [address]  HISTORICAL INFORMATION:  Selected notes from the MEDICAL RECORD NUMBER Referred by [name] for concern of SRF OS [name]:  Ocular Hx- PMH-   CURRENT MEDICATIONS: No current outpatient medications on file. (Ophthalmic Drugs)   No current facility-administered medications for this visit. (Ophthalmic Drugs)   Current Outpatient Medications (Other)  Medication Sig   acetaminophen  (TYLENOL ) 500 MG tablet Take 500-1,000 mg by mouth every 6 (six) hours as needed (for headaches).   amLODipine  (NORVASC ) 5 MG tablet Take 1 tablet (5 mg total) by mouth daily.   aspirin  EC 81 MG tablet Take 81 mg by mouth daily.   Bempedoic Acid  180 MG TABS Take 1 tablet (180 mg total) by mouth daily.   Cholecalciferol (VITAMIN D3) 50 MCG (2000 UT) TABS Take 4,000 Units by mouth daily.   furosemide  (LASIX ) 40 MG tablet Take 1 tablet (40 mg total) by mouth daily.   Multiple Vitamins-Minerals (PRESERVISION AREDS 2) CAPS Take 1 capsule by mouth daily.   nitroGLYCERIN  (NITROSTAT ) 0.4 MG SL tablet Place 1 tablet (0.4 mg total) under the tongue every 5 (five) minutes as needed for chest pain.   olmesartan (BENICAR) 40 MG tablet Take 40 mg by  mouth daily.   pravastatin  (PRAVACHOL ) 40 MG tablet Take 40 mg by mouth daily.   sildenafil (REVATIO) 20 MG tablet Take 40-100 mg by mouth daily as needed (ED).   vitamin B-12 (CYANOCOBALAMIN ) 1000 MCG tablet 1 tablet   No current facility-administered medications for this visit. (Other)   REVIEW OF SYSTEMS: ROS   Positive for: Gastrointestinal, Cardiovascular, Eyes, Allergic/Imm Last edited by [name] ORN, COT on 07/14/2023  1:12 PM.      ALLERGIES Allergies  Allergen Reactions   Ceclor [Cefaclor] Other (See Comments)    Reaction not recalled by the patient   Crestor  [Rosuvastatin ] Other (See Comments)    Muscle aches/pains   Lipitor [name] ]     Muscle aches/pains   Zetia [Ezetimibe] Other (See Comments)    Muscle aches/pains   PAST MEDICAL HISTORY Past Medical History:  Diagnosis Date   Cancer (HCC)    Melanoma and Basal Cell Carcinoma   History of kidney stones    Hyperlipidemia    Hypertension    Myocardial infarction (HCC)    Nephrolithiasis    Pneumonia    Psoriasis    Tobacco abuse    Past Surgical History:  Procedure Laterality Date   APPENDECTOMY  1969   COLONOSCOPY     CORONARY THROMBECTOMY N/A 02/10/2019   Procedure: Coronary  Thrombectomy;  Surgeon: Kevin Newman PARAS, MD;  Location: [facility];  Service: Cardiovascular;  Laterality: N/A;   CORONARY ULTRASOUND/IVUS N/A 02/10/2019   Procedure: Intravascular Ultrasound/IVUS;  Surgeon: Kevin Newman PARAS, MD;  Location: [facility];  Service: Cardiovascular;  Laterality: N/A;   CORONARY/GRAFT ACUTE MI REVASCULARIZATION N/A 02/10/2019   Procedure: Coronary/Graft Acute MI Revascularization;  Surgeon: Kevin Newman PARAS, MD;  Location: [facility];  Service: Cardiovascular;  Laterality: N/A;   INGUINAL HERNIA REPAIR Bilateral 07/10/2020   Procedure: LAPAROSCOPIC BILATERAL INGUINAL HERNIA REPAIR;  Surgeon: Kevin Calamity, MD;  Location: [facility];  Service: General;   Laterality: Bilateral;   INSERTION OF MESH Bilateral 07/10/2020   Procedure: INSERTION OF MESH;  Surgeon: Kevin Calamity, MD;  Location: [facility] OR;  Service: General;  Laterality: Bilateral;   LEFT HEART CATH AND CORONARY ANGIOGRAPHY N/A 02/10/2019   Procedure: LEFT HEART CATH AND CORONARY ANGIOGRAPHY;  Surgeon: Kevin Newman PARAS, MD;  Location: [facility];  Service: Cardiovascular;  Laterality: N/A;   TONSILLECTOMY  1966   FAMILY HISTORY Family History  Problem Relation Age of Onset   Stroke Mother    Hypertension Mother    Hyperlipidemia Mother    Heart attack Father    Heart disease Father    Hypertension Father    Hyperlipidemia Father    Breast cancer Sister    Liver cancer Sister    Bone cancer Sister    Hypertension Brother    SOCIAL HISTORY Social History   Tobacco Use   Smoking status: Former    Current packs/day: 0.00    Average packs/day: 1 pack/day for 43.0 years (43.0 ttl pk-yrs)    Types: Cigarettes    Start date: 02/10/1976    Quit date: 02/10/2019    Years since quitting: 4.4   Smokeless tobacco: Never  Vaping Use   Vaping status: Never Used  Substance Use Topics   Alcohol use: Yes    Comment: social   Drug use: No       OPHTHALMIC EXAM:  Base Eye Exam     Visual Acuity (Snellen - Linear)       Right Left   Dist cc 20/25 20/70 -3   Dist ph cc NI 20/60         Tonometry (Tonopen, 1:17 PM)       Right Left   Pressure 11 14         Pupils       Pupils Dark Light Shape React APD   Right PERRL 3 2 Round Brisk None   Left PERRL 3 2 Round Brisk None         Visual Fields       Left Right    Full Full         Extraocular Movement       Right Left    Full, Ortho Full, Ortho         Neuro/Psych     Oriented x3: Yes   Mood/Affect: Normal         Dilation     Both eyes: 2.5% Phenylephrine @ 1:17 PM           Slit Lamp and Fundus Exam     Slit Lamp Exam       Right Left   Lids/Lashes  Dermatochalasis - upper lid Dermatochalasis - upper lid   Conjunctiva/Sclera White and quiet White and quiet   Cornea trace PEE tear film debris  Anterior Chamber deep and clear deep and clear   Iris Round and dilated Round and dilated   Lens 2+ Nuclear sclerosis, 2+ Cortical cataract 2+ Nuclear sclerosis, 2+ Cortical cataract         Fundus Exam       Right Left   Disc Pink and Sharp, Compact Pink and Sharp, Compact   C/D Ratio 0.2 0.2   Macula Flat, Good foveal reflex, Drusen, RPE mottling and clumping, pigmented choroidal lesion just inside ST arcades (1.5Vx1.0H) -- flat, no SRF or orange pigment Flat, Blunted foveal reflex, central CNV with +SRF / edema, no heme   Vessels mild attenuation, mild tortuosity attenuated, Tortuous   Periphery Attached, No heme Attached, No heme           Refraction     Wearing Rx       Sphere Cylinder Axis Add   Right -0.50 +0.50 015 +2.25   Left -1.75 +2.25 063 +2.25           IMAGING AND PROCEDURES  Imaging and Procedures for 07/14/2023  OCT, Retina - OU - Both Eyes       Right Eye Quality was good. Central Foveal Thickness: 340. Progression has been stable. Findings include normal foveal contour, no IRF, no SRF, retinal drusen , vitreomacular adhesion (Focal hyper reflective choroidal lesion ST mac seen best on widefield).   Left Eye Quality was good. Central Foveal Thickness: 414. Progression has been stable. Findings include no IRF, abnormal foveal contour, retinal drusen , choroidal neovascular membrane, pigment epithelial detachment, subretinal fluid (central CNV / PED with SRF overlying -- minimal change from prior).   Notes *Images captured and stored on drive  Diagnosis / Impression:  OD: nonexudative ARMD; Focal hyper reflective choroidal lesion ST mac seen best on widefield OS: Central CNV / PED with SRF overlying -- exudative ARMD -- minimal change from prior  Clinical management:  See below  Abbreviations: NFP -  Normal foveal profile. CME - cystoid macular edema. PED - pigment epithelial detachment. IRF - intraretinal fluid. SRF - subretinal fluid. EZ - ellipsoid zone. ERM - epiretinal membrane. ORA - outer retinal atrophy. ORT - outer retinal tubulation. SRHM - subretinal hyper-reflective material. IRHM - intraretinal hyper-reflective material      Intravitreal Injection, Pharmacologic Agent - OS - Left Eye       Time Out 07/14/2023. 2:00 PM. Confirmed correct patient, procedure, site, and patient consented.   Anesthesia Topical anesthesia was used. Anesthetic medications included Lidocaine  2%, Proparacaine 0.5%.   Procedure Preparation included 5% betadine to ocular surface, eyelid speculum. A supplied needle was used.   Injection: 1.25 mg Bevacizumab  1.25mg /0.31ml   Route: Intravitreal, Site: Left Eye   NDC: 49757-939-98, Lot: [id number], Expiration date: 08/09/2023   Post-op Post injection exam found visual acuity of at least counting fingers. The patient tolerated the procedure well. There were no complications. The patient received written and verbal post procedure care education.           ASSESSMENT/PLAN:   ICD-10-CM   1. Exudative age-related macular degeneration of left eye with active choroidal neovascularization (HCC)  H35.3221 OCT, Retina - OU - Both Eyes    Intravitreal Injection, Pharmacologic Agent - OS - Left Eye    Bevacizumab  (AVASTIN ) SOLN 1.25 mg    2. Early dry stage nonexudative age-related macular degeneration of right eye  H35.3111     3. Essential hypertension  I10     4. Hypertensive retinopathy of both eyes  H35.033     5. Nevus of choroid of right eye  D31.31     6. Combined forms of age-related cataract of both eyes  H25.813      1. Exudative age related macular degeneration, OS  - at initial visit, pt reported several week history of decreased vision OS  - s/p IVA OS #1 (05.30.25)  - BCVA stable at 20/60  - OCT shows central CNV / PED with SRF overlying  -- minimal change from prior at 4+ weeks  **discussed possible decreased efficacy / resistance to Avastin  and potential benefit of switching from Avastin  to [facility]** - recommend IVA OS #2 today, 07.02.25 with follow up in 4 weeks  - pt wishes to be treated with IVA  - RBA of procedure discussed, questions answered - informed consent obtained and signed - see procedure note - will check Eylea auth for next visit  - f/u in 4 wks, DFE, OCT  2. Age related macular degeneration, non-exudative, right eye  - The incidence, anatomy, and pathology of dry AMD, risk of progression, and the AREDS and AREDS 2 study including smoking risks discussed with patient.  - Recommend amsler grid monitoring  - monitor  3,4. Hypertensive retinopathy OU - discussed importance of tight BP control - monitor  5. Choroidal Nevus, OD  - 1.5 x 1DD, flat, pigmented lesion just inside ST arcades  - no visual symptoms, SRF or orange pigment  - no drusen  - thickness < 2mm  - discussed findings, prognosis  - monitor  6. Mixed Cataract OU - The symptoms of cataract, surgical options, and treatments and risks were discussed with patient. - discussed diagnosis and progression - monitor  Ophthalmic Meds Ordered this visit:  Meds ordered this encounter  Medications   Bevacizumab  (AVASTIN ) SOLN 1.25 mg     Return in about 4 weeks (around 08/11/2023) for f/u exu ARMD OS, DFE, OCT, Possible Injxn.  There are no Patient Instructions on file for this visit.  Explained the diagnoses, plan, and follow up with the patient and they expressed understanding.  Patient expressed understanding of the importance of proper follow up care.   This document serves as a record of services personally performed by Redell JUDITHANN Hans, MD, PhD. It was created on their behalf by Almetta Pesa, an ophthalmic technician. The creation of this record is the provider's dictation and/or activities during the visit.    Electronically signed  by: Almetta Pesa, OA, 07/14/23  3:47 PM  This document serves as a record of services personally performed by Redell JUDITHANN Hans, MD, PhD. It was created on their behalf by Alan Swanson. Delores, OA an ophthalmic technician. The creation of this record is the provider's dictation and/or activities during the visit.    Electronically signed by: Alan Swanson. Delores, OA 07/14/23 3:47 PM  Redell JUDITHANN Hans, M.D., Ph.D. Diseases & Surgery of the Retina and Vitreous [facility] 07/14/2023  I have reviewed the above documentation for accuracy and completeness, and I agree with the above. Redell JUDITHANN Hans, M.D., Ph.D. 07/14/23 3:49 PM   Abbreviations: M myopia (nearsighted); A astigmatism; H hyperopia (farsighted); P presbyopia; Mrx spectacle prescription;  CTL contact lenses; OD right eye; OS left eye; OU both eyes  XT exotropia; ET esotropia; PEK punctate epithelial keratitis; PEE punctate epithelial erosions; DES dry eye syndrome; MGD meibomian gland dysfunction; ATs artificial tears; PFAT's preservative free artificial tears; NSC nuclear sclerotic cataract; PSC posterior subcapsular cataract; ERM epi-retinal membrane; PVD  posterior vitreous detachment; RD retinal detachment; DM diabetes mellitus; DR diabetic retinopathy; NPDR non-proliferative diabetic retinopathy; PDR proliferative diabetic retinopathy; CSME clinically significant macular edema; DME diabetic macular edema; dbh dot blot hemorrhages; CWS cotton wool spot; POAG primary open angle glaucoma; C/D cup-to-disc ratio; HVF humphrey visual field; GVF goldmann visual field; OCT optical coherence tomography; IOP intraocular pressure; BRVO Branch retinal vein occlusion; CRVO central retinal vein occlusion; CRAO central retinal artery occlusion; BRAO branch retinal artery occlusion; RT retinal tear; SB scleral buckle; PPV pars plana vitrectomy; VH Vitreous hemorrhage; PRP panretinal laser photocoagulation; IVK intravitreal kenalog; VMT  vitreomacular traction; MH Macular hole;  NVD neovascularization of the disc; NVE neovascularization elsewhere; AREDS age related eye disease study; ARMD age related macular degeneration; POAG primary open angle glaucoma; EBMD epithelial/anterior basement membrane dystrophy; ACIOL anterior chamber intraocular lens; IOL intraocular lens; PCIOL posterior chamber intraocular lens; Phaco/IOL phacoemulsification with intraocular lens placement; PRK photorefractive keratectomy; LASIK laser assisted in situ keratomileusis; HTN hypertension; DM diabetes mellitus; COPD chronic obstructive pulmonary disease

## 2023-07-14 ENCOUNTER — Ambulatory Visit (INDEPENDENT_AMBULATORY_CARE_PROVIDER_SITE_OTHER): Admitting: Ophthalmology

## 2023-07-14 ENCOUNTER — Encounter (INDEPENDENT_AMBULATORY_CARE_PROVIDER_SITE_OTHER): Payer: Self-pay | Admitting: Ophthalmology

## 2023-07-14 DIAGNOSIS — D3131 Benign neoplasm of right choroid: Secondary | ICD-10-CM

## 2023-07-14 DIAGNOSIS — H35033 Hypertensive retinopathy, bilateral: Secondary | ICD-10-CM

## 2023-07-14 DIAGNOSIS — H353111 Nonexudative age-related macular degeneration, right eye, early dry stage: Secondary | ICD-10-CM

## 2023-07-14 DIAGNOSIS — I10 Essential (primary) hypertension: Secondary | ICD-10-CM

## 2023-07-14 DIAGNOSIS — H353221 Exudative age-related macular degeneration, left eye, with active choroidal neovascularization: Secondary | ICD-10-CM

## 2023-07-14 DIAGNOSIS — H25813 Combined forms of age-related cataract, bilateral: Secondary | ICD-10-CM

## 2023-07-14 MED ORDER — BEVACIZUMAB CHEMO INJECTION 1.25MG/0.05ML SYRINGE FOR KALEIDOSCOPE
1.2500 mg | INTRAVITREAL | Status: AC | PRN
  Administered 2023-07-14: 1.25 mg via INTRAVITREAL

## 2023-07-15 ENCOUNTER — Ambulatory Visit: Attending: Cardiology | Admitting: Cardiology

## 2023-07-15 ENCOUNTER — Encounter: Payer: Self-pay | Admitting: Cardiology

## 2023-07-15 VITALS — BP 116/74 | HR 66 | Resp 16 | Ht 70.0 in | Wt 205.0 lb

## 2023-07-15 DIAGNOSIS — I25118 Atherosclerotic heart disease of native coronary artery with other forms of angina pectoris: Secondary | ICD-10-CM

## 2023-07-15 DIAGNOSIS — I739 Peripheral vascular disease, unspecified: Secondary | ICD-10-CM | POA: Diagnosis not present

## 2023-07-15 DIAGNOSIS — E782 Mixed hyperlipidemia: Secondary | ICD-10-CM

## 2023-07-15 DIAGNOSIS — I2510 Atherosclerotic heart disease of native coronary artery without angina pectoris: Secondary | ICD-10-CM

## 2023-07-15 DIAGNOSIS — I10 Essential (primary) hypertension: Secondary | ICD-10-CM

## 2023-07-15 MED ORDER — NITROGLYCERIN 0.4 MG SL SUBL: 0.4000 mg | SUBLINGUAL_TABLET | SUBLINGUAL | 1 refills | Status: AC | PRN

## 2023-07-15 MED ORDER — NITROGLYCERIN 0.4 MG SL SUBL: 0.4000 mg | SUBLINGUAL_TABLET | SUBLINGUAL | 1 refills | Status: DC | PRN

## 2023-07-15 MED ORDER — FUROSEMIDE 40 MG PO TABS: 40.0000 mg | ORAL_TABLET | Freq: Every day | ORAL | 3 refills | Status: AC

## 2023-07-15 NOTE — Progress Notes (Signed)
 Cardiology Office Note:  .   Date:  07/15/2023  ID:  [name], DOB 04-16-1955, MRN [id number] PCP: [name], MD  [facility] Providers Cardiologist:  Newman Lawrence, MD PCP: [name], MD  Chief Complaint  Patient presents with   Leg Swelling   Follow-up     [name] is a 68 y.o. male with hypertension, hyperlipidemia, CAD  History of Present Illness  Leg swelling has improved with Lasix .  He denies any particular chest pain shortness of breath.  Reviewed recent echocardiogram and lab results with the patient, details below.  He does report fatigue in his legs especially while walking.  Vitals:   07/15/23 0949  BP: 116/74  Pulse: 66  Resp: 16  SpO2: 96%       Review of Systems  Cardiovascular:  Positive for claudication. Negative for chest pain, dyspnea on exertion, leg swelling, palpitations and syncope.  Genitourinary:        Abdominal distention        Studies Reviewed: SABRA        EKG 05/24/2023: Sinus bradycardia Septal infarct (cited on or before 24-May-2023) When compared with ECG of 11-Feb-2019 06:19, QRS duration has increased    Labs 05/2023: Cr 1.18 ProBNP 104  03/2023: Chol 131, TG 226, HDL 36, LDL 59 K 4.2  07/2022: Hb 14.5 TSH 1.2  Echocardiogram 06/2023: 1. Smalll area of inferior basal wall hypokinesis . Left ventricular  ejection fraction, by estimation, is 55 to 60%. The left ventricle has  normal function. The left ventricle has no regional wall motion  abnormalities. Left ventricular diastolic  parameters were normal. The average left ventricular global longitudinal  strain is -19.8 %. The global longitudinal strain is normal.   2. Right ventricular systolic function is normal. The right ventricular  size is normal.   3. The mitral valve is abnormal. No evidence of mitral valve  regurgitation. No evidence of mitral stenosis.   4. The aortic valve is tricuspid. Aortic valve regurgitation is not   visualized. No aortic stenosis is present.   5. Aortic dilatation noted. There is mild dilatation of the aortic root,  measuring 38 mm.   6. The inferior vena cava is normal in size with greater than 50%  respiratory variability, suggesting right atrial pressure of 3 mmHg.   Physical Exam Vitals and nursing note reviewed.  Constitutional:      General: He is not in acute distress. Neck:     Vascular: No JVD.  Cardiovascular:     Rate and Rhythm: Normal rate and regular rhythm.     Heart sounds: Normal heart sounds. No murmur heard. Pulmonary:     Effort: Pulmonary effort is normal.     Breath sounds: Normal breath sounds. No wheezing or rales.  Musculoskeletal:     Right lower leg: No edema.     Left lower leg: No edema.      VISIT DIAGNOSES:   ICD-10-CM   1. Coronary artery disease involving native coronary artery of native heart without angina pectoris  I25.10 nitroGLYCERIN  (NITROSTAT ) 0.4 MG SL tablet    DISCONTINUED: nitroGLYCERIN  (NITROSTAT ) 0.4 MG SL tablet    2. Claudication (HCC)  I73.9 VAS US  LOWER EXTREMITY ARTERIAL DUPLEX    VAS US  ABI WITH/WO TBI    3. Coronary artery disease of native artery of native heart with stable angina pectoris (HCC)  I25.118     4. Mixed hyperlipidemia  E78.2     5. Essential  hypertension  I10         [name] is a 68 y.o. male with hypertension, hyperlipidemia, CAD  Assessment & Plan  Exertional dyspnea, leg edema: Nearly resolved on Lasix  40 mg daily. Echocardiogram, proBNP reassuring. Given improvement in symptoms, reasonable to continue Lasix  40 mg daily.    CAD: STEMI 2021 treated with primary PCI to pLAD.  Rest mild to moderate disease. Continue aspirin  81 mg daily, pravastatin  40 mg daily, bempedoic acid  180 mg daily.  Lipids very well-controlled. Triglycerides were elevated in March, lipid panel was nonfasting.  He will check fasting lipid panel with PCP in October. While he does use sildenafil, he  understands importance of avoiding concurrent usage of sildenafil and nitroglycerin , at least 36 hours with each other.  Hypertension: Blood pressure well-controlled on metoprolol  succinate alone. Okay to continue the same.    Claudication: His leg fatigue on exertion could be claudication, although resting vascular exam is fairly normal. Check lower extremity duplex with ABI.   Meds ordered this encounter  Medications   DISCONTD: nitroGLYCERIN  (NITROSTAT ) 0.4 MG SL tablet    Sig: Place 1 tablet (0.4 mg total) under the tongue every 5 (five) minutes as needed for chest pain.    Dispense:  30 tablet    Refill:  1   furosemide  (LASIX ) 40 MG tablet    Sig: Take 1 tablet (40 mg total) by mouth daily.    Dispense:  90 tablet    Refill:  3   nitroGLYCERIN  (NITROSTAT ) 0.4 MG SL tablet    Sig: Place 1 tablet (0.4 mg total) under the tongue every 5 (five) minutes as needed for chest pain. (Pt aware to not take Sildenafil with use of nitroglycerin .  Needs to take 36 hours apart from Sildenafil)    Dispense:  30 tablet    Refill:  1     F/u in 1 year  Signed, Newman JINNY Lawrence, MD

## 2023-07-15 NOTE — Patient Instructions (Addendum)
 MEDICATION: (Pt CAUTIONED to not take Sildenafil with use of nitroglycerin .  Needs to take 36 hours apart from Sildenafil)   Testing/Procedures: Lower Extremity Doppler Your physician has requested that you have a lower or upper extremity arterial duplex. This test is an ultrasound of the arteries in the legs or arms. It looks at arterial blood flow in the legs and arms. Allow one hour for Lower and Upper Arterial scans. There are no restrictions or special instructions.  Please note: We ask at that you not bring children with you during ultrasound (echo/ vascular) testing. Due to room size and safety concerns, children are not allowed in the ultrasound rooms during exams. Our front office staff cannot provide observation of children in our lobby area while testing is being conducted. An adult accompanying a patient to their appointment will only be allowed in the ultrasound room at the discretion of the ultrasound technician under special circumstances. We apologize for any inconvenience.   ABI Your physician has requested that you have an ankle brachial index (ABI). During this test an ultrasound and blood pressure cuff are used to evaluate the arteries that supply the arms and legs with blood. Allow thirty minutes for this exam. There are no restrictions or special instructions.  Please note: We ask at that you not bring children with you during ultrasound (echo/ vascular) testing. Due to room size and safety concerns, children are not allowed in the ultrasound rooms during exams. Our front office staff cannot provide observation of children in our lobby area while testing is being conducted. An adult accompanying a patient to their appointment will only be allowed in the ultrasound room at the discretion of the ultrasound technician under special circumstances. We apologize for any inconvenience.   Follow-Up: At [facility], you and your health needs are our priority.  As part of our  continuing mission to provide you with exceptional heart care, our providers are all part of one team.  This team includes your primary Cardiologist (physician) and Advanced Practice Providers or APPs (Physician Assistants and Nurse Practitioners) who all work together to provide you with the care you need, when you need it.  Your next appointment:   1 Year  Provider:   Newman JINNY Lawrence, MD    We recommend signing up for the patient portal called MyChart.  Sign up information is provided on this After Visit Summary.  MyChart is used to connect with patients for Virtual Visits (Telemedicine).  Patients are able to view lab/test results, encounter notes, upcoming appointments, etc.  Non-urgent messages can be sent to your provider as well.   To learn more about what you can do with MyChart, go to [email or web address].

## 2023-08-02 ENCOUNTER — Encounter (HOSPITAL_COMMUNITY)

## 2023-08-03 ENCOUNTER — Ambulatory Visit (HOSPITAL_BASED_OUTPATIENT_CLINIC_OR_DEPARTMENT_OTHER)
Admission: RE | Admit: 2023-08-03 | Discharge: 2023-08-03 | Disposition: A | Discharge status: Home / Self Care | Source: Ambulatory Visit | Attending: Cardiology | Admitting: Cardiology

## 2023-08-03 ENCOUNTER — Ambulatory Visit: Payer: Self-pay | Admitting: Cardiology

## 2023-08-03 ENCOUNTER — Ambulatory Visit (HOSPITAL_COMMUNITY)
Admission: RE | Admit: 2023-08-03 | Discharge: 2023-08-03 | Disposition: A | Discharge status: Home / Self Care | Source: Ambulatory Visit | Attending: Cardiology | Admitting: Cardiology

## 2023-08-03 DIAGNOSIS — I739 Peripheral vascular disease, unspecified: Secondary | ICD-10-CM

## 2023-08-03 LAB — VAS US ABI WITH/WO TBI
Left ABI: 1.11
Right ABI: 1.2

## 2023-08-05 NOTE — Progress Notes (Signed)
 [facility] - Clinic Note  08/11/2023   CHIEF COMPLAINT Patient presents for Retina Follow Up  HISTORY OF PRESENT ILLNESS: [name] is a 68 y.o. male who presents to the clinic today for:  HPI     Retina Follow Up   Patient presents with  Wet AMD.  In left eye.  This started 4 weeks ago.  I, the attending physician,  performed the HPI with the patient and updated documentation appropriately.        Comments   Patient here for 4 week retina follow up for  exu ARMD OS. Patient states vision may be a little bit better. No eye pain.       Last edited by Valdemar Rogue, MD on 08/11/2023  4:55 PM.       Referring physician: [name], MD [name] [place],  [place] [address]  HISTORICAL INFORMATION:  Selected notes from the MEDICAL RECORD NUMBER Referred by [name] for concern of SRF OS [name]:  Ocular Hx- PMH-   CURRENT MEDICATIONS: Current Outpatient Medications (Ophthalmic Drugs)  Medication Sig   Bevacizumab  1.25 MG/0.05ML SOSY by Intravitreal route.   No current facility-administered medications for this visit. (Ophthalmic Drugs)   Current Outpatient Medications (Other)  Medication Sig   acetaminophen  (TYLENOL ) 500 MG tablet Take 500-1,000 mg by mouth every 6 (six) hours as needed (for headaches).   aspirin  EC 81 MG tablet Take 81 mg by mouth daily.   Bempedoic Acid  180 MG TABS Take 1 tablet (180 mg total) by mouth daily.   Cholecalciferol (VITAMIN D3) 50 MCG (2000 UT) TABS Take 4,000 Units by mouth daily.   ezetimibe (ZETIA) 10 MG tablet Take 10 mg by mouth daily.   furosemide  (LASIX ) 40 MG tablet Take 1 tablet (40 mg total) by mouth daily.   Multiple Vitamins-Minerals (PRESERVISION AREDS 2) CAPS Take 1 capsule by mouth daily.   nitroGLYCERIN  (NITROSTAT ) 0.4 MG SL tablet Place 1 tablet (0.4 mg total) under the tongue every 5 (five) minutes as needed for chest pain. (Pt aware to not take Sildenafil with use of nitroglycerin .   Needs to take 36 hours apart from Sildenafil)   olmesartan (BENICAR) 40 MG tablet Take 40 mg by mouth daily.   pravastatin  (PRAVACHOL ) 40 MG tablet Take 40 mg by mouth daily.   sildenafil (REVATIO) 20 MG tablet Take 40-100 mg by mouth daily as needed (ED).   vitamin B-12 (CYANOCOBALAMIN ) 1000 MCG tablet 1 tablet   amLODipine  (NORVASC ) 5 MG tablet Take 1 tablet (5 mg total) by mouth daily.   No current facility-administered medications for this visit. (Other)   REVIEW OF SYSTEMS: ROS   Positive for: Gastrointestinal, Cardiovascular, Eyes, Allergic/Imm Last edited by Orval Asberry RAMAN, COA on 08/11/2023  1:57 PM.       ALLERGIES Allergies  Allergen Reactions   Ceclor [Cefaclor] Other (See Comments)    Reaction not recalled by the patient   Crestor  [Rosuvastatin ] Other (See Comments)    Muscle aches/pains   Lipitor [Atorvastatin ]     Muscle aches/pains   Zetia [Ezetimibe] Other (See Comments)    Muscle aches/pains   PAST MEDICAL HISTORY Past Medical History:  Diagnosis Date   Cancer (HCC)    Melanoma and Basal Cell Carcinoma   History of kidney stones    Hyperlipidemia    Hypertension    Myocardial infarction (HCC)    Nephrolithiasis    Pneumonia    Psoriasis  Tobacco abuse    Past Surgical History:  Procedure Laterality Date   APPENDECTOMY  1969   COLONOSCOPY     CORONARY THROMBECTOMY N/A 02/10/2019   Procedure: Coronary Thrombectomy;  Surgeon: Elmira Newman PARAS, MD;  Location: [facility];  Service: Cardiovascular;  Laterality: N/A;   CORONARY ULTRASOUND/IVUS N/A 02/10/2019   Procedure: Intravascular Ultrasound/IVUS;  Surgeon: Elmira Newman PARAS, MD;  Location: [facility];  Service: Cardiovascular;  Laterality: N/A;   CORONARY/GRAFT ACUTE MI REVASCULARIZATION N/A 02/10/2019   Procedure: Coronary/Graft Acute MI Revascularization;  Surgeon: Elmira Newman PARAS, MD;  Location: [facility];  Service: Cardiovascular;  Laterality: N/A;    INGUINAL HERNIA REPAIR Bilateral 07/10/2020   Procedure: LAPAROSCOPIC BILATERAL INGUINAL HERNIA REPAIR;  Surgeon: [name], MD;  Location: [facility] OR;  Service: General;  Laterality: Bilateral;   INSERTION OF MESH Bilateral 07/10/2020   Procedure: INSERTION OF MESH;  Surgeon: [name], MD;  Location: [facility] OR;  Service: General;  Laterality: Bilateral;   LEFT HEART CATH AND CORONARY ANGIOGRAPHY N/A 02/10/2019   Procedure: LEFT HEART CATH AND CORONARY ANGIOGRAPHY;  Surgeon: Elmira Newman PARAS, MD;  Location: [facility];  Service: Cardiovascular;  Laterality: N/A;   TONSILLECTOMY  1966   FAMILY HISTORY Family History  Problem Relation Age of Onset   Stroke Mother    Hypertension Mother    Hyperlipidemia Mother    Heart attack Father    Heart disease Father    Hypertension Father    Hyperlipidemia Father    Breast cancer Sister    Liver cancer Sister    Bone cancer Sister    Hypertension Brother    SOCIAL HISTORY Social History   Tobacco Use   Smoking status: Former    Current packs/day: 0.00    Average packs/day: 1 pack/day for 43.0 years (43.0 ttl pk-yrs)    Types: Cigarettes    Start date: 02/10/1976    Quit date: 02/10/2019    Years since quitting: 4.5   Smokeless tobacco: Never  Vaping Use   Vaping status: Never Used  Substance Use Topics   Alcohol use: Yes    Comment: social   Drug use: No       OPHTHALMIC EXAM:  Base Eye Exam     Visual Acuity (Snellen - Linear)       Right Left   Dist cc 20/20 20/70   Dist ph cc  20/60 -1    Correction: Glasses         Tonometry (Tonopen, 1:56 PM)       Right Left   Pressure 09 08         Pupils       Dark Light Shape React APD   Right 3 2 Round Brisk None   Left 3 2 Round Brisk None         Visual Fields (Counting fingers)       Left Right    Full Full         Extraocular Movement       Right Left    Full, Ortho Full, Ortho         Neuro/Psych     Oriented x3: Yes    Mood/Affect: Normal         Dilation     Both eyes: 1.0% Mydriacyl, 2.5% Phenylephrine @ 1:55 PM           Slit Lamp and Fundus Exam     Slit Lamp  Exam       Right Left   Lids/Lashes Dermatochalasis - upper lid Dermatochalasis - upper lid   Conjunctiva/Sclera White and quiet White and quiet   Cornea trace PEE tear film debris   Anterior Chamber deep and clear deep and clear   Iris Round and dilated Round and dilated   Lens 2+ Nuclear sclerosis, 2+ Cortical cataract 2+ Nuclear sclerosis, 2+ Cortical cataract         Fundus Exam       Right Left   Disc Pink and Sharp, Compact Pink and Sharp, Compact   C/D Ratio 0.2 0.2   Macula Flat, Good foveal reflex, Drusen, RPE mottling and clumping, pigmented choroidal lesion just inside ST arcades (1.5Vx1.0H) -- flat, no SRF or orange pigment Flat, Blunted foveal reflex, central CNV with +SRF / edema, no heme   Vessels mild attenuation, mild tortuosity attenuated, Tortuous   Periphery Attached, No heme Attached, No heme           Refraction     Wearing Rx       Sphere Cylinder Axis Add   Right -0.50 +0.50 015 +2.25   Left -1.75 +2.25 063 +2.25           IMAGING AND PROCEDURES  Imaging and Procedures for 08/11/2023  OCT, Retina - OU - Both Eyes       Right Eye Quality was good. Central Foveal Thickness: 336. Progression has been stable. Findings include normal foveal contour, no IRF, no SRF, retinal drusen , vitreomacular adhesion (Focal hyper reflective choroidal lesion ST mac seen best on widefield).   Left Eye Quality was good. Central Foveal Thickness: 401. Progression has been stable. Findings include no IRF, abnormal foveal contour, retinal drusen , choroidal neovascular membrane, pigment epithelial detachment, subretinal fluid (central CNV / PED with SRF overlying -- slight improvement superior portion).   Notes *Images captured and stored on drive  Diagnosis / Impression:  OD: nonexudative ARMD; Focal  hyper reflective choroidal lesion ST mac seen best on widefield OS: central CNV / PED with SRF overlying -- slight improvement superior portion  Clinical management:  See below  Abbreviations: NFP - Normal foveal profile. CME - cystoid macular edema. PED - pigment epithelial detachment. IRF - intraretinal fluid. SRF - subretinal fluid. EZ - ellipsoid zone. ERM - epiretinal membrane. ORA - outer retinal atrophy. ORT - outer retinal tubulation. SRHM - subretinal hyper-reflective material. IRHM - intraretinal hyper-reflective material      Intravitreal Injection, Pharmacologic Agent - OS - Left Eye       Time Out 08/11/2023. 3:06 PM. Confirmed correct patient, procedure, site, and patient consented.   Anesthesia Topical anesthesia was used. Anesthetic medications included Lidocaine  2%, Proparacaine 0.5%.   Procedure Preparation included 5% betadine to ocular surface, eyelid speculum. A supplied needle was used.   Injection: 1.25 mg Bevacizumab  1.25mg /0.53ml   Route: Intravitreal, Site: Left Eye   NDC: 49757-939-98, Lot: [id number]$$[id number]$ , Expiration date: 09/05/2023   Post-op Post injection exam found visual acuity of at least counting fingers. The patient tolerated the procedure well. There were no complications. The patient received written and verbal post procedure care education.            ASSESSMENT/PLAN:   ICD-10-CM   1. Exudative age-related macular degeneration of left eye with active choroidal neovascularization (HCC)  H35.3221 OCT, Retina - OU - Both Eyes    Intravitreal Injection, Pharmacologic Agent - OS - Left Eye    Bevacizumab  (AVASTIN ) SOLN 1.25  mg    2. Combined forms of age-related cataract of both eyes  H25.813     3. Early dry stage nonexudative age-related macular degeneration of right eye  H35.3111     4. Essential hypertension  I10     5. Hypertensive retinopathy of both eyes  H35.033     6. Nevus of choroid of right eye  D31.31       1. Exudative age  related macular degeneration, OS  - at initial visit, pt reported several week history of decreased vision OS  - s/p IVA OS #1 (05.30.25), #2 (07.02.25)  - BCVA stable at 20/60  - OCT shows central CNV / PED with SRF overlying -- minimal change from prior, ?slight improvement superior portion  **discussed possible decreased efficacy / resistance to Avastin  and potential benefit of switching from Avastin  to [facility]** - recommend IVA OS #3 today, 07.30.25 with follow up in 4 weeks  - pt wishes to be treated with IVA  - RBA of procedure discussed, questions answered - informed consent obtained and signed - see procedure note - will check Eylea auth for next visit  - f/u in 4 wks, DFE, OCT  2. Age related macular degeneration, non-exudative, right eye  - The incidence, anatomy, and pathology of dry AMD, risk of progression, and the AREDS and AREDS 2 study including smoking risks discussed with patient.  - Recommend amsler grid monitoring  - monitor  3,4. Hypertensive retinopathy OU - discussed importance of tight BP control - monitor  5. Choroidal Nevus, OD  - 1.5 x 1DD, flat, pigmented lesion just inside ST arcades  - no visual symptoms, SRF or orange pigment  - no drusen  - thickness < 2mm  - discussed findings, prognosis  - monitor  6. Mixed Cataract OU - The symptoms of cataract, surgical options, and treatments and risks were discussed with patient. - discussed diagnosis and progression - monitor  Ophthalmic Meds Ordered this visit:  Meds ordered this encounter  Medications   Bevacizumab  (AVASTIN ) SOLN 1.25 mg     Return in about 4 weeks (around 09/08/2023) for f/u exu ARMD OS, DFE, OCT, Possible Injxn.  There are no Patient Instructions on file for this visit.  Explained the diagnoses, plan, and follow up with the patient and they expressed understanding.  Patient expressed understanding of the importance of proper follow up care.   This document serves as a record of  services personally performed by Redell JUDITHANN Hans, MD, PhD. It was created on their behalf by Almetta Pesa, an ophthalmic technician. The creation of this record is the provider's dictation and/or activities during the visit.    Electronically signed by: Almetta Pesa, OA, 08/11/23  4:57 PM  This document serves as a record of services personally performed by Redell JUDITHANN Hans, MD, PhD. It was created on their behalf by Alan PARAS. Delores, OA an ophthalmic technician. The creation of this record is the provider's dictation and/or activities during the visit.    Electronically signed by: Alan PARAS. Delores, OA 08/11/23 4:57 PM  Redell JUDITHANN Hans, M.D., Ph.D. Diseases & Surgery of the Retina and Vitreous [facility] 08/11/2023  I have reviewed the above documentation for accuracy and completeness, and I agree with the above. Redell JUDITHANN Hans, M.D., Ph.D. 08/11/23 4:58 PM   Abbreviations: M myopia (nearsighted); A astigmatism; H hyperopia (farsighted); P presbyopia; Mrx spectacle prescription;  CTL contact lenses; OD right eye; OS left eye; OU both eyes  XT exotropia; ET esotropia; PEK punctate epithelial keratitis; PEE punctate epithelial erosions; DES dry eye syndrome; MGD meibomian gland dysfunction; ATs artificial tears; PFAT's preservative free artificial tears; NSC nuclear sclerotic cataract; PSC posterior subcapsular cataract; ERM epi-retinal membrane; PVD posterior vitreous detachment; RD retinal detachment; DM diabetes mellitus; DR diabetic retinopathy; NPDR non-proliferative diabetic retinopathy; PDR proliferative diabetic retinopathy; CSME clinically significant macular edema; DME diabetic macular edema; dbh dot blot hemorrhages; CWS cotton wool spot; POAG primary open angle glaucoma; C/D cup-to-disc ratio; HVF humphrey visual field; GVF goldmann visual field; OCT optical coherence tomography; IOP intraocular pressure; BRVO Branch retinal vein occlusion; CRVO central retinal  vein occlusion; CRAO central retinal artery occlusion; BRAO branch retinal artery occlusion; RT retinal tear; SB scleral buckle; PPV pars plana vitrectomy; VH Vitreous hemorrhage; PRP panretinal laser photocoagulation; IVK intravitreal kenalog; VMT vitreomacular traction; MH Macular hole;  NVD neovascularization of the disc; NVE neovascularization elsewhere; AREDS age related eye disease study; ARMD age related macular degeneration; POAG primary open angle glaucoma; EBMD epithelial/anterior basement membrane dystrophy; ACIOL anterior chamber intraocular lens; IOL intraocular lens; PCIOL posterior chamber intraocular lens; Phaco/IOL phacoemulsification with intraocular lens placement; PRK photorefractive keratectomy; LASIK laser assisted in situ keratomileusis; HTN hypertension; DM diabetes mellitus; COPD chronic obstructive pulmonary disease

## 2023-08-11 ENCOUNTER — Encounter (INDEPENDENT_AMBULATORY_CARE_PROVIDER_SITE_OTHER): Payer: Self-pay | Admitting: Ophthalmology

## 2023-08-11 ENCOUNTER — Ambulatory Visit (INDEPENDENT_AMBULATORY_CARE_PROVIDER_SITE_OTHER): Admitting: Ophthalmology

## 2023-08-11 DIAGNOSIS — I10 Essential (primary) hypertension: Secondary | ICD-10-CM | POA: Diagnosis not present

## 2023-08-11 DIAGNOSIS — H353111 Nonexudative age-related macular degeneration, right eye, early dry stage: Secondary | ICD-10-CM | POA: Diagnosis not present

## 2023-08-11 DIAGNOSIS — H25813 Combined forms of age-related cataract, bilateral: Secondary | ICD-10-CM

## 2023-08-11 DIAGNOSIS — H353221 Exudative age-related macular degeneration, left eye, with active choroidal neovascularization: Secondary | ICD-10-CM

## 2023-08-11 DIAGNOSIS — D3131 Benign neoplasm of right choroid: Secondary | ICD-10-CM

## 2023-08-11 DIAGNOSIS — H35033 Hypertensive retinopathy, bilateral: Secondary | ICD-10-CM | POA: Diagnosis not present

## 2023-08-11 MED ORDER — BEVACIZUMAB CHEMO INJECTION 1.25MG/0.05ML SYRINGE FOR KALEIDOSCOPE
1.2500 mg | INTRAVITREAL | Status: AC | PRN
  Administered 2023-08-11: 1.25 mg via INTRAVITREAL

## 2023-08-18 DIAGNOSIS — J019 Acute sinusitis, unspecified: Secondary | ICD-10-CM | POA: Diagnosis not present

## 2023-09-07 NOTE — Progress Notes (Shared)
 [facility] - Clinic Note  09/08/2023   CHIEF COMPLAINT Patient presents for Retina Follow Up  HISTORY OF PRESENT ILLNESS: [name] is a 68 y.o. male who presents to the clinic today for:  HPI     Retina Follow Up   Patient presents with  Wet AMD.  In left eye.  This started 3 months ago.  Severity is moderate.  Duration of 1 month.  Since onset it is gradually improving.  I, the attending physician,  performed the HPI with the patient and updated documentation appropriately.        Comments   Pt states vision seems to be improving in the left eye. Pt denies FOL/floaters/pain. Pt does not use ats.      Last edited by Valdemar Rogue, MD on 09/08/2023  9:07 PM.    Patient feels that he is seeing slightly better.  Referring physician: [name], MD [phone] [name] [place],  [place] [address]  HISTORICAL INFORMATION:  Selected notes from the MEDICAL RECORD NUMBER Referred by [name] for concern of SRF OS [name]:  Ocular Hx- PMH-   CURRENT MEDICATIONS: Current Outpatient Medications (Ophthalmic Drugs)  Medication Sig   Bevacizumab  1.25 MG/0.05ML SOSY by Intravitreal route.   No current facility-administered medications for this visit. (Ophthalmic Drugs)   Current Outpatient Medications (Other)  Medication Sig   acetaminophen  (TYLENOL ) 500 MG tablet Take 500-1,000 mg by mouth every 6 (six) hours as needed (for headaches).   amLODipine  (NORVASC ) 5 MG tablet Take 1 tablet (5 mg total) by mouth daily.   aspirin  EC 81 MG tablet Take 81 mg by mouth daily.   Bempedoic Acid  180 MG TABS Take 1 tablet (180 mg total) by mouth daily.   Cholecalciferol (VITAMIN D3) 50 MCG (2000 UT) TABS Take 4,000 Units by mouth daily.   ezetimibe (ZETIA) 10 MG tablet Take 10 mg by mouth daily.   furosemide  (LASIX ) 40 MG tablet Take 1 tablet (40 mg total) by mouth daily.   Multiple Vitamins-Minerals (PRESERVISION AREDS 2) CAPS Take 1 capsule by mouth daily.    nitroGLYCERIN  (NITROSTAT ) 0.4 MG SL tablet Place 1 tablet (0.4 mg total) under the tongue every 5 (five) minutes as needed for chest pain. (Pt aware to not take Sildenafil with use of nitroglycerin .  Needs to take 36 hours apart from Sildenafil)   olmesartan (BENICAR) 40 MG tablet Take 40 mg by mouth daily.   pravastatin  (PRAVACHOL ) 40 MG tablet Take 40 mg by mouth daily.   sildenafil (REVATIO) 20 MG tablet Take 40-100 mg by mouth daily as needed (ED).   vitamin B-12 (CYANOCOBALAMIN ) 1000 MCG tablet 1 tablet   No current facility-administered medications for this visit. (Other)   REVIEW OF SYSTEMS: ROS   Positive for: Gastrointestinal, Cardiovascular, Eyes, Allergic/Imm Last edited by Elnor Avelina RAMAN, COT on 09/08/2023  2:06 PM.     ALLERGIES Allergies  Allergen Reactions   Ceclor [Cefaclor] Other (See Comments)    Reaction not recalled by the patient   Crestor  Holliday.Harpin ] Other (See Comments)    Muscle aches/pains   Lipitor [Atorvastatin ]     Muscle aches/pains   Zetia [Ezetimibe] Other (See Comments)    Muscle aches/pains   PAST MEDICAL HISTORY Past Medical History:  Diagnosis Date   Cancer (HCC)    Melanoma and Basal Cell Carcinoma   History of kidney stones    Hyperlipidemia    Hypertension    Myocardial infarction (HCC)  Nephrolithiasis    Pneumonia    Psoriasis    Tobacco abuse    Past Surgical History:  Procedure Laterality Date   APPENDECTOMY  1969   COLONOSCOPY     CORONARY THROMBECTOMY N/A 02/10/2019   Procedure: Coronary Thrombectomy;  Surgeon: Elmira Newman PARAS, MD;  Location: [facility];  Service: Cardiovascular;  Laterality: N/A;   CORONARY ULTRASOUND/IVUS N/A 02/10/2019   Procedure: Intravascular Ultrasound/IVUS;  Surgeon: Elmira Newman PARAS, MD;  Location: [facility];  Service: Cardiovascular;  Laterality: N/A;   CORONARY/GRAFT ACUTE MI REVASCULARIZATION N/A 02/10/2019   Procedure: Coronary/Graft Acute MI Revascularization;   Surgeon: Elmira Newman PARAS, MD;  Location: [facility];  Service: Cardiovascular;  Laterality: N/A;   INGUINAL HERNIA REPAIR Bilateral 07/10/2020   Procedure: LAPAROSCOPIC BILATERAL INGUINAL HERNIA REPAIR;  Surgeon: [name], MD;  Location: [facility] OR;  Service: General;  Laterality: Bilateral;   INSERTION OF MESH Bilateral 07/10/2020   Procedure: INSERTION OF MESH;  Surgeon: [name], MD;  Location[facility];  Service: General;  Laterality: Bilateral;   LEFT HEART CATH AND CORONARY ANGIOGRAPHY N/A 02/10/2019   Procedure: LEFT HEART CATH AND CORONARY ANGIOGRAPHY;  Surgeon: Elmira Newman PARAS, MD;  Location: [facility];  Service: Cardiovascular;  Laterality: N/A;   TONSILLECTOMY  1966   FAMILY HISTORY Family History  Problem Relation Age of Onset   Stroke Mother    Hypertension Mother    Hyperlipidemia Mother    Heart attack Father    Heart disease Father    Hypertension Father    Hyperlipidemia Father    Breast cancer Sister    Liver cancer Sister    Bone cancer Sister    Hypertension Brother    SOCIAL HISTORY Social History   Tobacco Use   Smoking status: Former    Current packs/day: 0.00    Average packs/day: 1 pack/day for 43.0 years (43.0 ttl pk-yrs)    Types: Cigarettes    Start date: 02/10/1976    Quit date: 02/10/2019    Years since quitting: 4.5   Smokeless tobacco: Never  Vaping Use   Vaping status: Never Used  Substance Use Topics   Alcohol use: Yes    Comment: social   Drug use: No       OPHTHALMIC EXAM:  Base Eye Exam     Visual Acuity (Snellen - Linear)       Right Left   Dist cc 20/20 20/50   Dist ph cc  20/40 -1    Correction: Glasses         Tonometry (Tonopen, 2:03 PM)       Right Left   Pressure 10 12         Pupils       Pupils Dark Light Shape React APD   Right PERRL 3 2 Round Brisk None   Left PERRL 3 2 Round Brisk None         Visual Fields       Left Right    Full Full          Extraocular Movement       Right Left    Full, Ortho Full, Ortho         Neuro/Psych     Oriented x3: Yes   Mood/Affect: Normal         Dilation     Both eyes: 1.0% Mydriacyl, 2.5% Phenylephrine @ 2:03 PM  Slit Lamp and Fundus Exam     Slit Lamp Exam       Right Left   Lids/Lashes Dermatochalasis - upper lid Dermatochalasis - upper lid   Conjunctiva/Sclera White and quiet White and quiet   Cornea trace PEE tear film debris   Anterior Chamber deep and clear deep and clear   Iris Round and dilated Round and dilated   Lens 2+ Nuclear sclerosis, 2+ Cortical cataract 2+ Nuclear sclerosis, 2+ Cortical cataract         Fundus Exam       Right Left   Disc Pink and Sharp, Compact Pink and Sharp, Compact   C/D Ratio 0.2 0.2   Macula Flat, Good foveal reflex, Drusen, RPE mottling and clumping, pigmented choroidal lesion just inside ST arcades (1.5Vx1.0H) -- flat, no SRF or orange pigment Flat, Blunted foveal reflex, central CNV with +SRF / edema, no heme   Vessels mild attenuation, mild tortuosity attenuated, Tortuous   Periphery Attached, No heme Attached, No heme           Refraction     Wearing Rx       Sphere Cylinder Axis Add   Right -0.50 +0.50 015 +2.25   Left -1.75 +2.25 063 +2.25           IMAGING AND PROCEDURES  Imaging and Procedures for 09/08/2023  OCT, Retina - OU - Both Eyes       Right Eye Quality was good. Central Foveal Thickness: 343. Progression has been stable. Findings include normal foveal contour, no IRF, no SRF, retinal drusen , vitreomacular adhesion (Focal hyper reflective choroidal lesion ST mac seen best on widefield).   Left Eye Quality was good. Central Foveal Thickness: 398. Progression has been stable. Findings include no IRF, abnormal foveal contour, retinal drusen , choroidal neovascular membrane, pigment epithelial detachment, subretinal fluid (central CNV / PED with SRF overlying -- slight improvement  superior portion).   Notes *Images captured and stored on drive  Diagnosis / Impression:  OD: nonexudative ARMD; Focal hyper reflective choroidal lesion ST mac seen best on widefield OS: central CNV / PED with SRF overlying -- slight improvement superior portion  Clinical management:  See below  Abbreviations: NFP - Normal foveal profile. CME - cystoid macular edema. PED - pigment epithelial detachment. IRF - intraretinal fluid. SRF - subretinal fluid. EZ - ellipsoid zone. ERM - epiretinal membrane. ORA - outer retinal atrophy. ORT - outer retinal tubulation. SRHM - subretinal hyper-reflective material. IRHM - intraretinal hyper-reflective material      Intravitreal Injection, Pharmacologic Agent - OS - Left Eye       Time Out 09/08/2023. 2:34 PM. Confirmed correct patient, procedure, site, and patient consented.   Anesthesia Topical anesthesia was used. Anesthetic medications included Lidocaine  2%, Proparacaine 0.5%.   Procedure Preparation included 5% betadine to ocular surface, eyelid speculum. A (32g) needle was used.   Injection: 2 mg aflibercept  2 MG/0.05ML   Route: Intravitreal, Site: Left Eye   NDC: [id number], Lot: [id number], Expiration date: 11/11/2024, Waste: 0 mL   Post-op Post injection exam found visual acuity of at least counting fingers. The patient tolerated the procedure well. There were no complications. The patient received written and verbal post procedure care education.           ASSESSMENT/PLAN:   ICD-10-CM   1. Exudative age-related macular degeneration of left eye with active choroidal neovascularization (HCC)  H35.3221 OCT, Retina - OU - Both Eyes    Intravitreal Injection,  Pharmacologic Agent - OS - Left Eye    aflibercept  (EYLEA ) SOLN 2 mg    2. Combined forms of age-related cataract of both eyes  H25.813     3. Early dry stage nonexudative age-related macular degeneration of right eye  H35.3111     4. Essential hypertension  I10      5. Hypertensive retinopathy of both eyes  H35.033     6. Nevus of choroid of right eye  D31.31      1. Exudative age related macular degeneration, OS - at initial visit, pt reported several week history of decreased vision OS - s/p IVA OS #1 (05.30.25), #2 (07.02.25), #3 (07.30.25) - IVA resistance =========================  - BCVA 20/40 - OCT shows central CNV / PED with persistent SRF overlying -- minimal change from prior **discussed possible decreased efficacy / resistance to Avastin  and potential benefit of switching from Avastin  to Eylea ** - recommend switching to IVE OS #1 today, 08.27.25 with follow up in 4 weeks  - pt wishes to be treated with IVE  - RBA of procedure discussed, questions answered - IVE OS informed consent obtained and signed OS 08.27.25 - see procedure note - Eylea  auth approved but patient would be responsible for 20% - patient has agreed to pay the 20%  - f/u in 4 wks, DFE, OCT  2. Age related macular degeneration, non-exudative, right eye - The incidence, anatomy, and pathology of dry AMD, risk of progression, and the AREDS and AREDS 2 study including smoking risks discussed with patient.  - Recommend amsler grid monitoring  - monitor  3,4. Hypertensive retinopathy OU - discussed importance of tight BP control - monitor  5. Choroidal Nevus, OD  - 1.5 x 1DD, flat, pigmented lesion just inside ST arcades  - no visual symptoms, SRF or orange pigment  - no drusen  - thickness < 2mm  - discussed findings, prognosis  - monitor  6. Mixed Cataract OU - The symptoms of cataract, surgical options, and treatments and risks were discussed with patient. - discussed diagnosis and progression - monitor  Ophthalmic Meds Ordered this visit:  Meds ordered this encounter  Medications   aflibercept  (EYLEA ) SOLN 2 mg     Return in about 4 weeks (around 10/06/2023) for f/u Ex. AMD OS , DFE, OCT, Possible, IVE, OS.  There are no Patient Instructions on file  for this visit.  Explained the diagnoses, plan, and follow up with the patient and they expressed understanding.  Patient expressed understanding of the importance of proper follow up care.   This document serves as a record of services personally performed by Redell JUDITHANN Hans, MD, PhD. It was created on their behalf by Almetta Pesa, an ophthalmic technician. The creation of this record is the provider's dictation and/or activities during the visit.    Electronically signed by: Almetta Pesa, OA, 09/08/23  9:14 PM  This document serves as a record of services personally performed by Redell JUDITHANN Hans, MD, PhD. It was created on their behalf by Wanda GEANNIE Keens, COT an ophthalmic technician. The creation of this record is the provider's dictation and/or activities during the visit.    Electronically signed by:  Wanda GEANNIE Keens, COT  09/08/23 9:14 PM  Redell JUDITHANN Hans, M.D., Ph.D. Diseases & Surgery of the Retina and Vitreous [facility] 09/08/2023  I have reviewed the above documentation for accuracy and completeness, and I agree with the above. Redell JUDITHANN Hans, M.D., Ph.D. 09/08/23 9:14  PM   Abbreviations: M myopia (nearsighted); A astigmatism; H hyperopia (farsighted); P presbyopia; Mrx spectacle prescription;  CTL contact lenses; OD right eye; OS left eye; OU both eyes  XT exotropia; ET esotropia; PEK punctate epithelial keratitis; PEE punctate epithelial erosions; DES dry eye syndrome; MGD meibomian gland dysfunction; ATs artificial tears; PFAT's preservative free artificial tears; NSC nuclear sclerotic cataract; PSC posterior subcapsular cataract; ERM epi-retinal membrane; PVD posterior vitreous detachment; RD retinal detachment; DM diabetes mellitus; DR diabetic retinopathy; NPDR non-proliferative diabetic retinopathy; PDR proliferative diabetic retinopathy; CSME clinically significant macular edema; DME diabetic macular edema; dbh dot blot hemorrhages; CWS  cotton wool spot; POAG primary open angle glaucoma; C/D cup-to-disc ratio; HVF humphrey visual field; GVF goldmann visual field; OCT optical coherence tomography; IOP intraocular pressure; BRVO Branch retinal vein occlusion; CRVO central retinal vein occlusion; CRAO central retinal artery occlusion; BRAO branch retinal artery occlusion; RT retinal tear; SB scleral buckle; PPV pars plana vitrectomy; VH Vitreous hemorrhage; PRP panretinal laser photocoagulation; IVK intravitreal kenalog; VMT vitreomacular traction; MH Macular hole;  NVD neovascularization of the disc; NVE neovascularization elsewhere; AREDS age related eye disease study; ARMD age related macular degeneration; POAG primary open angle glaucoma; EBMD epithelial/anterior basement membrane dystrophy; ACIOL anterior chamber intraocular lens; IOL intraocular lens; PCIOL posterior chamber intraocular lens; Phaco/IOL phacoemulsification with intraocular lens placement; PRK photorefractive keratectomy; LASIK laser assisted in situ keratomileusis; HTN hypertension; DM diabetes mellitus; COPD chronic obstructive pulmonary disease

## 2023-09-08 ENCOUNTER — Ambulatory Visit (INDEPENDENT_AMBULATORY_CARE_PROVIDER_SITE_OTHER): Admitting: Ophthalmology

## 2023-09-08 ENCOUNTER — Encounter (INDEPENDENT_AMBULATORY_CARE_PROVIDER_SITE_OTHER): Payer: Self-pay | Admitting: Ophthalmology

## 2023-09-08 DIAGNOSIS — H35033 Hypertensive retinopathy, bilateral: Secondary | ICD-10-CM

## 2023-09-08 DIAGNOSIS — H353111 Nonexudative age-related macular degeneration, right eye, early dry stage: Secondary | ICD-10-CM

## 2023-09-08 DIAGNOSIS — D3131 Benign neoplasm of right choroid: Secondary | ICD-10-CM

## 2023-09-08 DIAGNOSIS — I10 Essential (primary) hypertension: Secondary | ICD-10-CM | POA: Diagnosis not present

## 2023-09-08 DIAGNOSIS — H353221 Exudative age-related macular degeneration, left eye, with active choroidal neovascularization: Secondary | ICD-10-CM

## 2023-09-08 DIAGNOSIS — H25813 Combined forms of age-related cataract, bilateral: Secondary | ICD-10-CM

## 2023-09-08 MED ORDER — AFLIBERCEPT 2MG/0.05ML IZ SOLN FOR KALEIDOSCOPE
2.0000 mg | INTRAVITREAL | Status: AC | PRN
  Administered 2023-09-08: 2 mg via INTRAVITREAL

## 2023-10-05 NOTE — Progress Notes (Signed)
 [facility] - Clinic Note  10/07/2023   CHIEF COMPLAINT Patient presents for Retina Follow Up  HISTORY OF PRESENT ILLNESS: [name] is a 68 y.o. male who presents to the clinic today for:  HPI     Retina Follow Up   Patient presents with  Wet AMD.  In left eye.  This started 4 weeks ago.  I, the attending physician,  performed the HPI with the patient and updated documentation appropriately.        Comments   Patient here for 4 weeks retina follow up for exu ARMD OS. Patient states vision may be a little bit better. No eye pain.       Last edited by Valdemar Rogue, MD on 10/07/2023  3:52 PM.    Patient feels the vision has slightly improved since his last injection.  Referring physician: Fleeta Zerita DASEN, MD [address] [place],  [place] [address]  HISTORICAL INFORMATION:  Selected notes from the MEDICAL RECORD NUMBER Referred by [name] for concern of SRF OS [name]:  Ocular Hx- PMH-   CURRENT MEDICATIONS: Current Outpatient Medications (Ophthalmic Drugs)  Medication Sig   Bevacizumab  1.25 MG/0.05ML SOSY by Intravitreal route.   No current facility-administered medications for this visit. (Ophthalmic Drugs)   Current Outpatient Medications (Other)  Medication Sig   acetaminophen  (TYLENOL ) 500 MG tablet Take 500-1,000 mg by mouth every 6 (six) hours as needed (for headaches).   aspirin  EC 81 MG tablet Take 81 mg by mouth daily.   Bempedoic Acid  180 MG TABS Take 1 tablet (180 mg total) by mouth daily.   Cholecalciferol (VITAMIN D3) 50 MCG (2000 UT) TABS Take 4,000 Units by mouth daily.   ezetimibe (ZETIA) 10 MG tablet Take 10 mg by mouth daily.   furosemide  (LASIX ) 40 MG tablet Take 1 tablet (40 mg total) by mouth daily.   Multiple Vitamins-Minerals (PRESERVISION AREDS 2) CAPS Take 1 capsule by mouth daily.   nitroGLYCERIN  (NITROSTAT ) 0.4 MG SL tablet Place 1 tablet (0.4 mg total) under the tongue every 5 (five) minutes as needed for chest  pain. (Pt aware to not take Sildenafil with use of nitroglycerin .  Needs to take 36 hours apart from Sildenafil)   olmesartan (BENICAR) 40 MG tablet Take 40 mg by mouth daily.   pravastatin  (PRAVACHOL ) 40 MG tablet Take 40 mg by mouth daily.   sildenafil (REVATIO) 20 MG tablet Take 40-100 mg by mouth daily as needed (ED).   vitamin B-12 (CYANOCOBALAMIN ) 1000 MCG tablet 1 tablet   amLODipine  (NORVASC ) 5 MG tablet Take 1 tablet (5 mg total) by mouth daily.   No current facility-administered medications for this visit. (Other)   REVIEW OF SYSTEMS: ROS   Positive for: Gastrointestinal, Cardiovascular, Eyes, Allergic/Imm Last edited by Orval Asberry RAMAN, COA on 10/07/2023  9:33 AM.      ALLERGIES Allergies  Allergen Reactions   Ceclor [Cefaclor] Other (See Comments)    Reaction not recalled by the patient   Crestor  [Rosuvastatin ] Other (See Comments)    Muscle aches/pains   Lipitor [Atorvastatin ]     Muscle aches/pains   Zetia [Ezetimibe] Other (See Comments)    Muscle aches/pains   PAST MEDICAL HISTORY Past Medical History:  Diagnosis Date   Cancer (HCC)    Melanoma and Basal Cell Carcinoma   History of kidney stones    Hyperlipidemia    Hypertension    Myocardial infarction (HCC)    Nephrolithiasis    Pneumonia  Psoriasis    Tobacco abuse    Past Surgical History:  Procedure Laterality Date   APPENDECTOMY  1969   COLONOSCOPY     CORONARY THROMBECTOMY N/A 02/10/2019   Procedure: Coronary Thrombectomy;  Surgeon: Elmira Newman PARAS, MD;  Location: [facility];  Service: Cardiovascular;  Laterality: N/A;   CORONARY ULTRASOUND/IVUS N/A 02/10/2019   Procedure: Intravascular Ultrasound/IVUS;  Surgeon: Elmira Newman PARAS, MD;  Location: [facility];  Service: Cardiovascular;  Laterality: N/A;   CORONARY/GRAFT ACUTE MI REVASCULARIZATION N/A 02/10/2019   Procedure: Coronary/Graft Acute MI Revascularization;  Surgeon: Elmira Newman PARAS, MD;  Location: [facility]  INVAS[facility] CV [facility];  Service: Cardiovascular;  Laterality: N/A;   INGUINAL HERNIA REPAIR Bilateral 07/10/2020   Procedure: LAPAROSCOPIC BILATERAL INGUINAL HERNIA REPAIR;  Surgeon: [name], MD;  Location: [facility] OR;  Service: General;  Laterality: Bilateral;   INSERTION OF MESH Bilateral 07/10/2020   Procedure: INSERTION OF MESH;  Surgeon: [name], MD;  Location: [facility] OR;  Service: General;  Laterality: Bilateral;   LEFT HEART CATH AND CORONARY ANGIOGRAPHY N/A 02/10/2019   Procedure: LEFT HEART CATH AND CORONARY ANGIOGRAPHY;  Surgeon: Elmira Newman PARAS, MD;  Location: [facility];  Service: Cardiovascular;  Laterality: N/A;   TONSILLECTOMY  1966   FAMILY HISTORY Family History  Problem Relation Age of Onset   Stroke Mother    Hypertension Mother    Hyperlipidemia Mother    Heart attack Father    Heart disease Father    Hypertension Father    Hyperlipidemia Father    Breast cancer Sister    Liver cancer Sister    Bone cancer Sister    Hypertension Brother    SOCIAL HISTORY Social History   Tobacco Use   Smoking status: Former    Current packs/day: 0.00    Average packs/day: 1 pack/day for 43.0 years (43.0 ttl pk-yrs)    Types: Cigarettes    Start date: 02/10/1976    Quit date: 02/10/2019    Years since quitting: 4.6   Smokeless tobacco: Never  Vaping Use   Vaping status: Never Used  Substance Use Topics   Alcohol use: Yes    Comment: social   Drug use: No       OPHTHALMIC EXAM:  Base Eye Exam     Visual Acuity (Snellen - Linear)       Right Left   Dist cc 20/20 20/50   Dist ph cc  20/40    Correction: Glasses         Tonometry (Tonopen, 9:31 AM)       Right Left   Pressure 15 13         Pupils       Dark Light Shape React APD   Right 3 2 Round Brisk None   Left 3 2 Round Brisk None         Visual Fields (Counting fingers)       Left Right    Full Full         Extraocular Movement       Right Left    Full, Ortho Full,  Ortho         Neuro/Psych     Oriented x3: Yes   Mood/Affect: Normal         Dilation     Both eyes: 1.0% Mydriacyl, 2.5% Phenylephrine @ 9:31 AM           Slit Lamp and Fundus Exam  Slit Lamp Exam       Right Left   Lids/Lashes Dermatochalasis - upper lid Dermatochalasis - upper lid   Conjunctiva/Sclera White and quiet White and quiet   Cornea trace PEE tear film debris   Anterior Chamber deep and clear deep and clear   Iris Round and dilated Round and dilated   Lens 2+ Nuclear sclerosis, 2+ Cortical cataract 2+ Nuclear sclerosis, 2+ Cortical cataract         Fundus Exam       Right Left   Disc Pink and Sharp, Compact Pink and Sharp, Compact   C/D Ratio 0.2 0.2   Macula Flat, Good foveal reflex, Drusen, RPE mottling and clumping, pigmented choroidal lesion just inside ST arcades (1.5Vx1.0H) -- flat, no SRF or orange pigment Flat, Blunted foveal reflex, central CNV with +SRF / edema-- improved, no heme   Vessels mild attenuation, mild tortuosity attenuated, Tortuous   Periphery Attached, No heme Attached, No heme           Refraction     Wearing Rx       Sphere Cylinder Axis Add   Right -0.50 +0.50 015 +2.25   Left -1.75 +2.25 063 +2.25           IMAGING AND PROCEDURES  Imaging and Procedures for 10/07/2023  OCT, Retina - OU - Both Eyes       Right Eye Quality was good. Central Foveal Thickness: 336. Progression has been stable. Findings include normal foveal contour, no IRF, no SRF, retinal drusen , vitreomacular adhesion (Focal hyper reflective choroidal lesion ST mac seen best on widefield).   Left Eye Quality was good. Central Foveal Thickness: 361. Progression has improved. Findings include no IRF, abnormal foveal contour, retinal drusen , choroidal neovascular membrane, pigment epithelial detachment, subretinal fluid (central CNV / PED with interval improvement in SRF overlying ).   Notes *Images captured and stored on  drive  Diagnosis / Impression:  OD: nonexudative ARMD; Focal hyper reflective choroidal lesion ST mac seen best on widefield OS: central CNV / PED with interval improvement in SRF overlying   Clinical management:  See below  Abbreviations: NFP - Normal foveal profile. CME - cystoid macular edema. PED - pigment epithelial detachment. IRF - intraretinal fluid. SRF - subretinal fluid. EZ - ellipsoid zone. ERM - epiretinal membrane. ORA - outer retinal atrophy. ORT - outer retinal tubulation. SRHM - subretinal hyper-reflective material. IRHM - intraretinal hyper-reflective material      Intravitreal Injection, Pharmacologic Agent - OS - Left Eye       Time Out 10/07/2023. 10:25 AM. Confirmed correct patient, procedure, site, and patient consented.   Anesthesia Topical anesthesia was used. Anesthetic medications included Lidocaine  2%, Proparacaine 0.5%.   Procedure Preparation included 5% betadine to ocular surface, eyelid speculum. A (32g) needle was used.   Injection: 2 mg aflibercept  2 MG/0.05ML   Route: Intravitreal, Site: Left Eye   NDC: [id number], Lot: [id number], Expiration date: 11/11/2024, Waste: 0 mL   Post-op Post injection exam found visual acuity of at least counting fingers. The patient tolerated the procedure well. There were no complications. The patient received written and verbal post procedure care education.            ASSESSMENT/PLAN:   ICD-10-CM   1. Exudative age-related macular degeneration of left eye with active choroidal neovascularization (HCC)  H35.3221 OCT, Retina - OU - Both Eyes    Intravitreal Injection, Pharmacologic Agent - OS - Left Eye  aflibercept  (EYLEA ) SOLN 2 mg    2. Combined forms of age-related cataract of both eyes  H25.813     3. Early dry stage nonexudative age-related macular degeneration of right eye  H35.3111     4. Essential hypertension  I10     5. Hypertensive retinopathy of both eyes  H35.033     6. Nevus of  choroid of right eye  D31.31      1. Exudative age related macular degeneration, OS - at initial visit, pt reported several week history of decreased vision OS - s/p IVA OS #1 (05.30.25), #2 (07.02.25), #3 (07.30.25) - IVA resistance ========== - s/p IVE OS #1 (08.27.25)  - BCVA OS 20/40 - stable - OCT shows central CNV / PED with interval improvement in SRF overlying at 4 weeks - recommend IVE OS #2 today, 09.25.25 with follow up in 4 weeks  - pt wishes to be treated with IVE  - RBA of procedure discussed, questions answered - IVE OS informed consent obtained and signed OS 08.27.25 - see procedure note - Eylea  auth approved but patient would be responsible for 20% - patient has agreed to pay the 20%  - f/u in 4 wks, DFE, OCT, possible injxn  2. Age related macular degeneration, non-exudative, right eye - The incidence, anatomy, and pathology of dry AMD, risk of progression, and the AREDS and AREDS 2 study including smoking risks discussed with patient.  - Recommend amsler grid monitoring  - monitor  3,4. Hypertensive retinopathy OU - discussed importance of tight BP control - monitor  5. Choroidal Nevus, OD  - 1.5 x 1DD, flat, pigmented lesion just inside ST arcades  - no visual symptoms, SRF or orange pigment  - no drusen  - thickness < 2mm  - discussed findings, prognosis  - monitor  6. Mixed Cataract OU - The symptoms of cataract, surgical options, and treatments and risks were discussed with patient. - discussed diagnosis and progression - monitor  Ophthalmic Meds Ordered this visit:  Meds ordered this encounter  Medications   aflibercept  (EYLEA ) SOLN 2 mg     Return in about 4 weeks (around 11/04/2023) for f/u, Ex. AMD, DFE, OCT, Possible, IVE, OS.  There are no Patient Instructions on file for this visit.  Explained the diagnoses, plan, and follow up with the patient and they expressed understanding.  Patient expressed understanding of the importance of proper  follow up care.   This document serves as a record of services personally performed by Redell JUDITHANN Hans, MD, PhD. It was created on their behalf by Auston Muzzy, COMT. The creation of this record is the provider's dictation and/or activities during the visit.  Electronically signed by: Auston Muzzy, COMT 10/07/23 3:54 PM  This document serves as a record of services personally performed by Redell JUDITHANN Hans, MD, PhD. It was created on their behalf by Wanda GEANNIE Keens, COT an ophthalmic technician. The creation of this record is the provider's dictation and/or activities during the visit.    Electronically signed by:  Wanda GEANNIE Keens, COT  10/07/23 3:54 PM  Redell JUDITHANN Hans, M.D., Ph.D. Diseases & Surgery of the Retina and Vitreous [facility]  I have reviewed the above documentation for accuracy and completeness, and I agree with the above. Redell JUDITHANN Hans, M.D., Ph.D. 10/07/23 3:55 PM   Abbreviations: M myopia (nearsighted); A astigmatism; H hyperopia (farsighted); P presbyopia; Mrx spectacle prescription;  CTL contact lenses; OD right eye; OS left  eye; OU both eyes  XT exotropia; ET esotropia; PEK punctate epithelial keratitis; PEE punctate epithelial erosions; DES dry eye syndrome; MGD meibomian gland dysfunction; ATs artificial tears; PFAT's preservative free artificial tears; NSC nuclear sclerotic cataract; PSC posterior subcapsular cataract; ERM epi-retinal membrane; PVD posterior vitreous detachment; RD retinal detachment; DM diabetes mellitus; DR diabetic retinopathy; NPDR non-proliferative diabetic retinopathy; PDR proliferative diabetic retinopathy; CSME clinically significant macular edema; DME diabetic macular edema; dbh dot blot hemorrhages; CWS cotton wool spot; POAG primary open angle glaucoma; C/D cup-to-disc ratio; HVF humphrey visual field; GVF goldmann visual field; OCT optical coherence tomography; IOP intraocular pressure; BRVO Branch retinal vein  occlusion; CRVO central retinal vein occlusion; CRAO central retinal artery occlusion; BRAO branch retinal artery occlusion; RT retinal tear; SB scleral buckle; PPV pars plana vitrectomy; VH Vitreous hemorrhage; PRP panretinal laser photocoagulation; IVK intravitreal kenalog; VMT vitreomacular traction; MH Macular hole;  NVD neovascularization of the disc; NVE neovascularization elsewhere; AREDS age related eye disease study; ARMD age related macular degeneration; POAG primary open angle glaucoma; EBMD epithelial/anterior basement membrane dystrophy; ACIOL anterior chamber intraocular lens; IOL intraocular lens; PCIOL posterior chamber intraocular lens; Phaco/IOL phacoemulsification with intraocular lens placement; PRK photorefractive keratectomy; LASIK laser assisted in situ keratomileusis; HTN hypertension; DM diabetes mellitus; COPD chronic obstructive pulmonary disease

## 2023-10-06 ENCOUNTER — Encounter (INDEPENDENT_AMBULATORY_CARE_PROVIDER_SITE_OTHER): Admitting: Ophthalmology

## 2023-10-07 ENCOUNTER — Ambulatory Visit (INDEPENDENT_AMBULATORY_CARE_PROVIDER_SITE_OTHER): Admitting: Ophthalmology

## 2023-10-07 ENCOUNTER — Encounter (INDEPENDENT_AMBULATORY_CARE_PROVIDER_SITE_OTHER): Payer: Self-pay | Admitting: Ophthalmology

## 2023-10-07 DIAGNOSIS — I10 Essential (primary) hypertension: Secondary | ICD-10-CM

## 2023-10-07 DIAGNOSIS — H353111 Nonexudative age-related macular degeneration, right eye, early dry stage: Secondary | ICD-10-CM | POA: Diagnosis not present

## 2023-10-07 DIAGNOSIS — D3131 Benign neoplasm of right choroid: Secondary | ICD-10-CM

## 2023-10-07 DIAGNOSIS — H25813 Combined forms of age-related cataract, bilateral: Secondary | ICD-10-CM

## 2023-10-07 DIAGNOSIS — H35033 Hypertensive retinopathy, bilateral: Secondary | ICD-10-CM | POA: Diagnosis not present

## 2023-10-07 DIAGNOSIS — H353221 Exudative age-related macular degeneration, left eye, with active choroidal neovascularization: Secondary | ICD-10-CM

## 2023-10-07 MED ORDER — AFLIBERCEPT 2MG/0.05ML IZ SOLN FOR KALEIDOSCOPE
2.0000 mg | INTRAVITREAL | Status: AC | PRN
  Administered 2023-10-07: 2 mg via INTRAVITREAL

## 2023-10-11 DIAGNOSIS — M25562 Pain in left knee: Secondary | ICD-10-CM | POA: Diagnosis not present

## 2023-10-15 DIAGNOSIS — M25562 Pain in left knee: Secondary | ICD-10-CM | POA: Diagnosis not present

## 2023-11-03 NOTE — Progress Notes (Signed)
 [facility] - Clinic Note  11/04/2023   CHIEF COMPLAINT Patient presents for Retina Follow Up  HISTORY OF PRESENT ILLNESS: [name] is a 68 y.o. male who presents to the clinic today for:  HPI     Retina Follow Up   Patient presents with  Wet AMD.  In left eye.  This started 4 weeks ago.  I, the attending physician,  performed the HPI with the patient and updated documentation appropriately.        Comments   Patient here for 4 weeks retina follow up for exu ARMD OS. Patient states vision is a little bit better. No eye pain.       Last edited by Valdemar Rogue, MD on 11/04/2023 12:05 PM.    Patient states  Referring physician: [name], MD [name] [place],  [place] [address]  HISTORICAL INFORMATION:  Selected notes from the MEDICAL RECORD NUMBER Referred by [name] for concern of SRF OS [name]:  Ocular Hx- PMH-   CURRENT MEDICATIONS: Current Outpatient Medications (Ophthalmic Drugs)  Medication Sig   Bevacizumab  1.25 MG/0.05ML SOSY by Intravitreal route.   No current facility-administered medications for this visit. (Ophthalmic Drugs)   Current Outpatient Medications (Other)  Medication Sig   acetaminophen  (TYLENOL ) 500 MG tablet Take 500-1,000 mg by mouth every 6 (six) hours as needed (for headaches).   aspirin  EC 81 MG tablet Take 81 mg by mouth daily.   Bempedoic Acid  180 MG TABS Take 1 tablet (180 mg total) by mouth daily.   Cholecalciferol (VITAMIN D3) 50 MCG (2000 UT) TABS Take 4,000 Units by mouth daily.   ezetimibe (ZETIA) 10 MG tablet Take 10 mg by mouth daily.   furosemide  (LASIX ) 40 MG tablet Take 1 tablet (40 mg total) by mouth daily.   Multiple Vitamins-Minerals (PRESERVISION AREDS 2) CAPS Take 1 capsule by mouth daily.   nitroGLYCERIN  (NITROSTAT ) 0.4 MG SL tablet Place 1 tablet (0.4 mg total) under the tongue every 5 (five) minutes as needed for chest pain. (Pt aware to not take Sildenafil with use of  nitroglycerin .  Needs to take 36 hours apart from Sildenafil)   olmesartan (BENICAR) 40 MG tablet Take 40 mg by mouth daily.   pravastatin  (PRAVACHOL ) 40 MG tablet Take 40 mg by mouth daily.   sildenafil (REVATIO) 20 MG tablet Take 40-100 mg by mouth daily as needed (ED).   vitamin B-12 (CYANOCOBALAMIN ) 1000 MCG tablet 1 tablet   amLODipine  (NORVASC ) 5 MG tablet Take 1 tablet (5 mg total) by mouth daily.   No current facility-administered medications for this visit. (Other)   REVIEW OF SYSTEMS: ROS   Positive for: Gastrointestinal, Cardiovascular, Eyes, Allergic/Imm Last edited by Orval Asberry RAMAN, COA on 11/04/2023  8:47 AM.     ALLERGIES Allergies  Allergen Reactions   Ceclor [Cefaclor] Other (See Comments)    Reaction not recalled by the patient   Crestor  [Rosuvastatin ] Other (See Comments)    Muscle aches/pains   Lipitor [Atorvastatin ]     Muscle aches/pains   Zetia [Ezetimibe] Other (See Comments)    Muscle aches/pains   PAST MEDICAL HISTORY Past Medical History:  Diagnosis Date   Cancer (HCC)    Melanoma and Basal Cell Carcinoma   History of kidney stones    Hyperlipidemia    Hypertension    Myocardial infarction (HCC)    Nephrolithiasis    Pneumonia    Psoriasis    Tobacco abuse  Past Surgical History:  Procedure Laterality Date   APPENDECTOMY  1969   COLONOSCOPY     CORONARY THROMBECTOMY N/A 02/10/2019   Procedure: Coronary Thrombectomy;  Surgeon: Elmira Newman PARAS, MD;  Location: [facility];  Service: Cardiovascular;  Laterality: N/A;   CORONARY ULTRASOUND/IVUS N/A 02/10/2019   Procedure: Intravascular Ultrasound/IVUS;  Surgeon: Elmira Newman PARAS, MD;  Location: [facility];  Service: Cardiovascular;  Laterality: N/A;   CORONARY/GRAFT ACUTE MI REVASCULARIZATION N/A 02/10/2019   Procedure: Coronary/Graft Acute MI Revascularization;  Surgeon: Elmira Newman PARAS, MD;  Location: [facility];  Service: Cardiovascular;  Laterality:  N/A;   INGUINAL HERNIA REPAIR Bilateral 07/10/2020   Procedure: LAPAROSCOPIC BILATERAL INGUINAL HERNIA REPAIR;  Surgeon: [name], MD;  Location: [facility] OR;  Service: General;  Laterality: Bilateral;   INSERTION OF MESH Bilateral 07/10/2020   Procedure: INSERTION OF MESH;  Surgeon: [name], MD;  Location: [facility] OR;  Service: General;  Laterality: Bilateral;   LEFT HEART CATH AND CORONARY ANGIOGRAPHY N/A 02/10/2019   Procedure: LEFT HEART CATH AND CORONARY ANGIOGRAPHY;  Surgeon: Elmira Newman PARAS, MD;  Location: [facility];  Service: Cardiovascular;  Laterality: N/A;   TONSILLECTOMY  1966   FAMILY HISTORY Family History  Problem Relation Age of Onset   Stroke Mother    Hypertension Mother    Hyperlipidemia Mother    Heart attack Father    Heart disease Father    Hypertension Father    Hyperlipidemia Father    Breast cancer Sister    Liver cancer Sister    Bone cancer Sister    Hypertension Brother    SOCIAL HISTORY Social History   Tobacco Use   Smoking status: Former    Current packs/day: 0.00    Average packs/day: 1 pack/day for 43.0 years (43.0 ttl pk-yrs)    Types: Cigarettes    Start date: 02/10/1976    Quit date: 02/10/2019    Years since quitting: 4.7   Smokeless tobacco: Never  Vaping Use   Vaping status: Never Used  Substance Use Topics   Alcohol use: Yes    Comment: social   Drug use: No       OPHTHALMIC EXAM:  Base Eye Exam     Visual Acuity (Snellen - Linear)       Right Left   Dist cc 20/20 20/50   Dist ph cc  20/40    Correction: Glasses         Tonometry (Tonopen, 8:45 AM)       Right Left   Pressure 16 14         Pupils       Dark Light Shape React APD   Right 3 2 Round Brisk None   Left 3 2 Round Brisk None         Visual Fields (Counting fingers)       Left Right    Full Full         Extraocular Movement       Right Left    Full, Ortho Full, Ortho         Neuro/Psych     Oriented x3: Yes    Mood/Affect: Normal         Dilation     Both eyes: 1.0% Mydriacyl, 2.5% Phenylephrine @ 8:45 AM           Slit Lamp and Fundus Exam     Slit Lamp Exam  Right Left   Lids/Lashes Dermatochalasis - upper lid Dermatochalasis - upper lid   Conjunctiva/Sclera White and quiet White and quiet   Cornea trace PEE tear film debris   Anterior Chamber deep and clear deep and clear   Iris Round and dilated Round and dilated   Lens 2+ Nuclear sclerosis, 2+ Cortical cataract 2+ Nuclear sclerosis, 2+ Cortical cataract   Anterior Vitreous mild syneresis mild syneresis         Fundus Exam       Right Left   Disc Pink and Sharp, Compact Pink and Sharp, Compact   C/D Ratio 0.2 0.2   Macula Flat, Good foveal reflex, Drusen, RPE mottling and clumping, pigmented choroidal lesion just inside ST arcades (1.5Vx1.0H) -- flat, no SRF or orange pigment Flat, Blunted foveal reflex, central CNV with +SRF / edema, no heme   Vessels mild attenuation, mild tortuosity attenuated, Tortuous   Periphery Attached, No heme Attached, No heme           Refraction     Wearing Rx       Sphere Cylinder Axis Add   Right -0.50 +0.50 015 +2.25   Left -1.75 +2.25 063 +2.25           IMAGING AND PROCEDURES  Imaging and Procedures for 11/04/2023  OCT, Retina - OU - Both Eyes       Right Eye Quality was good. Central Foveal Thickness: 335. Progression has been stable. Findings include normal foveal contour, no IRF, no SRF, retinal drusen , vitreomacular adhesion (Focal hyper reflective choroidal lesion ST mac seen best on widefield).   Left Eye Quality was good. Central Foveal Thickness: 368. Progression has been stable. Findings include no IRF, abnormal foveal contour, retinal drusen , choroidal neovascular membrane, pigment epithelial detachment, subretinal fluid (central CNV / PED with persistent SRF overlying ).   Notes *Images captured and stored on drive  Diagnosis / Impression:   OD: nonexudative ARMD; Focal hyper reflective choroidal lesion ST mac seen best on widefield OS: central CNV / PED with persistent SRF overlying   Clinical management:  See below  Abbreviations: NFP - Normal foveal profile. CME - cystoid macular edema. PED - pigment epithelial detachment. IRF - intraretinal fluid. SRF - subretinal fluid. EZ - ellipsoid zone. ERM - epiretinal membrane. ORA - outer retinal atrophy. ORT - outer retinal tubulation. SRHM - subretinal hyper-reflective material. IRHM - intraretinal hyper-reflective material      Intravitreal Injection, Pharmacologic Agent - OS - Left Eye       Time Out 11/04/2023. 9:15 AM. Confirmed correct patient, procedure, site, and patient consented.   Anesthesia Topical anesthesia was used. Anesthetic medications included Lidocaine  2%, Proparacaine 0.5%.   Procedure Preparation included 5% betadine to ocular surface, eyelid speculum. A (32g) needle was used.   Injection: 2 mg aflibercept  2 MG/0.05ML   Route: Intravitreal, Site: Left Eye   NDC: [id number], Lot: [id number], Expiration date: 11/11/2024, Waste: 0 mL   Post-op Post injection exam found visual acuity of at least counting fingers. The patient tolerated the procedure well. There were no complications. The patient received written and verbal post procedure care education.           ASSESSMENT/PLAN:   ICD-10-CM   1. Exudative age-related macular degeneration of left eye with active choroidal neovascularization (HCC)  H35.3221 OCT, Retina - OU - Both Eyes    Intravitreal Injection, Pharmacologic Agent - OS - Left Eye    aflibercept  (EYLEA ) SOLN 2 mg  2. Combined forms of age-related cataract of both eyes  H25.813     3. Early dry stage nonexudative age-related macular degeneration of right eye  H35.3111     4. Essential hypertension  I10     5. Hypertensive retinopathy of both eyes  H35.033     6. Nevus of choroid of right eye  D31.31      1. Exudative  age related macular degeneration, OS - at initial visit, pt reported several week history of decreased vision OS - s/p IVA OS #1 (05.30.25), #2 (07.02.25), #3 (07.30.25) - IVA resistance ========== - s/p IVE OS #1 (08.27.25), #2 (09.25.25)  - BCVA OS 20/40 - stable - OCT shows central CNV / PED with persistent SRF overlying at 4 weeks - recommend IVE OS #3 today, 10.23.25 with follow up in 4 weeks  - pt wishes to be treated with IVE  - RBA of procedure discussed, questions answered - IVE OS informed consent obtained and signed OS 08.27.25 - see procedure note - Eylea  auth approved, [employer] covered 100% after copay-verify at each visit  - f/u in 4 wks, DFE, OCT, possible injxn  2. Age related macular degeneration, non-exudative, right eye - The incidence, anatomy, and pathology of dry AMD, risk of progression, and the AREDS and AREDS 2 study including smoking risks discussed with patient.  - Recommend amsler grid monitoring  - monitor  3,4. Hypertensive retinopathy OU - discussed importance of tight BP control - monitor  5. Choroidal Nevus, OD  - 1.5 x 1DD, flat, pigmented lesion just inside ST arcades  - no visual symptoms, SRF or orange pigment  - no drusen  - thickness < 2mm  - discussed findings, prognosis  - monitor  6. Mixed Cataract OU - The symptoms of cataract, surgical options, and treatments and risks were discussed with patient. - discussed diagnosis and progression - monitor  Ophthalmic Meds Ordered this visit:  Meds ordered this encounter  Medications   aflibercept  (EYLEA ) SOLN 2 mg     Return in about 4 weeks (around 12/02/2023) for exu ARMD OS, DFE, OCT, Possible Injxn.  There are no Patient Instructions on file for this visit.  Explained the diagnoses, plan, and follow up with the patient and they expressed understanding.  Patient expressed understanding of the importance of proper follow up care.   This document serves as a record of services personally  performed by Redell JUDITHANN Hans, MD, PhD. It was created on their behalf by Auston Muzzy, COMT. The creation of this record is the provider's dictation and/or activities during the visit.  Electronically signed by: Auston Muzzy, COMT 11/04/23 12:13 PM  This document serves as a record of services personally performed by Redell JUDITHANN Hans, MD, PhD. It was created on their behalf by Almetta Pesa, an ophthalmic technician. The creation of this record is the provider's dictation and/or activities during the visit.    Electronically signed by: Almetta Pesa, OA, 11/04/23  12:13 PM  Redell JUDITHANN Hans, M.D., Ph.D. Diseases & Surgery of the Retina and Vitreous [facility]  I have reviewed the above documentation for accuracy and completeness, and I agree with the above. Redell JUDITHANN Hans, M.D., Ph.D. 11/04/23 12:13 PM   Abbreviations: M myopia (nearsighted); A astigmatism; H hyperopia (farsighted); P presbyopia; Mrx spectacle prescription;  CTL contact lenses; OD right eye; OS left eye; OU both eyes  XT exotropia; ET esotropia; PEK punctate epithelial keratitis; PEE punctate epithelial erosions; DES dry eye  syndrome; MGD meibomian gland dysfunction; ATs artificial tears; PFAT's preservative free artificial tears; NSC nuclear sclerotic cataract; PSC posterior subcapsular cataract; ERM epi-retinal membrane; PVD posterior vitreous detachment; RD retinal detachment; DM diabetes mellitus; DR diabetic retinopathy; NPDR non-proliferative diabetic retinopathy; PDR proliferative diabetic retinopathy; CSME clinically significant macular edema; DME diabetic macular edema; dbh dot blot hemorrhages; CWS cotton wool spot; POAG primary open angle glaucoma; C/D cup-to-disc ratio; HVF humphrey visual field; GVF goldmann visual field; OCT optical coherence tomography; IOP intraocular pressure; BRVO Branch retinal vein occlusion; CRVO central retinal vein occlusion; CRAO central retinal artery occlusion;  BRAO branch retinal artery occlusion; RT retinal tear; SB scleral buckle; PPV pars plana vitrectomy; VH Vitreous hemorrhage; PRP panretinal laser photocoagulation; IVK intravitreal kenalog; VMT vitreomacular traction; MH Macular hole;  NVD neovascularization of the disc; NVE neovascularization elsewhere; AREDS age related eye disease study; ARMD age related macular degeneration; POAG primary open angle glaucoma; EBMD epithelial/anterior basement membrane dystrophy; ACIOL anterior chamber intraocular lens; IOL intraocular lens; PCIOL posterior chamber intraocular lens; Phaco/IOL phacoemulsification with intraocular lens placement; PRK photorefractive keratectomy; LASIK laser assisted in situ keratomileusis; HTN hypertension; DM diabetes mellitus; COPD chronic obstructive pulmonary disease

## 2023-11-04 ENCOUNTER — Encounter (INDEPENDENT_AMBULATORY_CARE_PROVIDER_SITE_OTHER): Payer: Self-pay | Admitting: Ophthalmology

## 2023-11-04 ENCOUNTER — Ambulatory Visit (INDEPENDENT_AMBULATORY_CARE_PROVIDER_SITE_OTHER): Admitting: Ophthalmology

## 2023-11-04 DIAGNOSIS — I10 Essential (primary) hypertension: Secondary | ICD-10-CM

## 2023-11-04 DIAGNOSIS — H353221 Exudative age-related macular degeneration, left eye, with active choroidal neovascularization: Secondary | ICD-10-CM

## 2023-11-04 DIAGNOSIS — H35033 Hypertensive retinopathy, bilateral: Secondary | ICD-10-CM | POA: Diagnosis not present

## 2023-11-04 DIAGNOSIS — H353111 Nonexudative age-related macular degeneration, right eye, early dry stage: Secondary | ICD-10-CM

## 2023-11-04 DIAGNOSIS — D3131 Benign neoplasm of right choroid: Secondary | ICD-10-CM

## 2023-11-04 DIAGNOSIS — H25813 Combined forms of age-related cataract, bilateral: Secondary | ICD-10-CM

## 2023-11-04 MED ORDER — AFLIBERCEPT 2MG/0.05ML IZ SOLN FOR KALEIDOSCOPE
2.0000 mg | INTRAVITREAL | Status: AC | PRN
  Administered 2023-11-04: 2 mg via INTRAVITREAL

## 2023-11-08 DIAGNOSIS — M25562 Pain in left knee: Secondary | ICD-10-CM | POA: Diagnosis not present

## 2023-11-12 DIAGNOSIS — E538 Deficiency of other specified B group vitamins: Secondary | ICD-10-CM | POA: Diagnosis not present

## 2023-11-12 DIAGNOSIS — G4700 Insomnia, unspecified: Secondary | ICD-10-CM | POA: Diagnosis not present

## 2023-11-12 DIAGNOSIS — M25562 Pain in left knee: Secondary | ICD-10-CM | POA: Diagnosis not present

## 2023-11-12 DIAGNOSIS — K5900 Constipation, unspecified: Secondary | ICD-10-CM | POA: Diagnosis not present

## 2023-11-12 DIAGNOSIS — E559 Vitamin D deficiency, unspecified: Secondary | ICD-10-CM | POA: Diagnosis not present

## 2023-11-12 DIAGNOSIS — Z23 Encounter for immunization: Secondary | ICD-10-CM | POA: Diagnosis not present

## 2023-11-12 DIAGNOSIS — Z125 Encounter for screening for malignant neoplasm of prostate: Secondary | ICD-10-CM | POA: Diagnosis not present

## 2023-11-12 DIAGNOSIS — Z0000 Encounter for general adult medical examination without abnormal findings: Secondary | ICD-10-CM | POA: Diagnosis not present

## 2023-11-12 DIAGNOSIS — E782 Mixed hyperlipidemia: Secondary | ICD-10-CM | POA: Diagnosis not present

## 2023-11-22 DIAGNOSIS — M1712 Unilateral primary osteoarthritis, left knee: Secondary | ICD-10-CM | POA: Diagnosis not present

## 2023-11-22 DIAGNOSIS — S83242A Other tear of medial meniscus, current injury, left knee, initial encounter: Secondary | ICD-10-CM | POA: Diagnosis not present

## 2023-12-01 NOTE — Progress Notes (Signed)
 [facility] - Clinic Note  12/02/2023   CHIEF COMPLAINT Patient presents for Retina Follow Up  HISTORY OF PRESENT ILLNESS: [name] is a 68 y.o. male who presents to the clinic today for:  HPI     Retina Follow Up   Patient presents with  Wet AMD.  In left eye.  This started 4 weeks ago.  I, the attending physician,  performed the HPI with the patient and updated documentation appropriately.        Comments   Patient here for 4 weeks retina follow up for exu ARMD OS. Patient states vision about the same. No eye pain.      Last edited by Valdemar Rogue, MD on 12/02/2023 11:53 AM.     Patient states  Referring physician: [name], MD [phone] [name] [place],  [place] [address]  HISTORICAL INFORMATION:  Selected notes from the MEDICAL RECORD NUMBER Referred by [name] for concern of SRF OS [name]:  Ocular Hx- PMH-   CURRENT MEDICATIONS: Current Outpatient Medications (Ophthalmic Drugs)  Medication Sig   Bevacizumab  1.25 MG/0.05ML SOSY by Intravitreal route.   No current facility-administered medications for this visit. (Ophthalmic Drugs)   Current Outpatient Medications (Other)  Medication Sig   acetaminophen  (TYLENOL ) 500 MG tablet Take 500-1,000 mg by mouth every 6 (six) hours as needed (for headaches).   aspirin  EC 81 MG tablet Take 81 mg by mouth daily.   Bempedoic Acid  180 MG TABS Take 1 tablet (180 mg total) by mouth daily.   Cholecalciferol (VITAMIN D3) 50 MCG (2000 UT) TABS Take 4,000 Units by mouth daily.   ezetimibe (ZETIA) 10 MG tablet Take 10 mg by mouth daily.   Multiple Vitamins-Minerals (PRESERVISION AREDS 2) CAPS Take 1 capsule by mouth daily.   nitroGLYCERIN  (NITROSTAT ) 0.4 MG SL tablet Place 1 tablet (0.4 mg total) under the tongue every 5 (five) minutes as needed for chest pain. (Pt aware to not take Sildenafil with use of nitroglycerin .  Needs to take 36 hours apart from Sildenafil)   olmesartan (BENICAR) 40 MG  tablet Take 40 mg by mouth daily.   pravastatin  (PRAVACHOL ) 40 MG tablet Take 40 mg by mouth daily.   sildenafil (REVATIO) 20 MG tablet Take 40-100 mg by mouth daily as needed (ED).   vitamin B-12 (CYANOCOBALAMIN ) 1000 MCG tablet 1 tablet   amLODipine  (NORVASC ) 5 MG tablet Take 1 tablet (5 mg total) by mouth daily.   furosemide  (LASIX ) 40 MG tablet Take 1 tablet (40 mg total) by mouth daily.   No current facility-administered medications for this visit. (Other)   REVIEW OF SYSTEMS: ROS   Positive for: Gastrointestinal, Cardiovascular, Eyes, Allergic/Imm Last edited by Orval Asberry RAMAN, COA on 12/02/2023  9:13 AM.     ALLERGIES Allergies  Allergen Reactions   Ceclor [Cefaclor] Other (See Comments)    Reaction not recalled by the patient   Crestor  [Rosuvastatin ] Other (See Comments)    Muscle aches/pains   Lipitor [Atorvastatin ]     Muscle aches/pains   Zetia [Ezetimibe] Other (See Comments)    Muscle aches/pains   PAST MEDICAL HISTORY Past Medical History:  Diagnosis Date   Cancer (HCC)    Melanoma and Basal Cell Carcinoma   History of kidney stones    Hyperlipidemia    Hypertension    Myocardial infarction (HCC)    Nephrolithiasis    Pneumonia    Psoriasis    Tobacco abuse    Past Surgical  History:  Procedure Laterality Date   APPENDECTOMY  1969   COLONOSCOPY     CORONARY THROMBECTOMY N/A 02/10/2019   Procedure: Coronary Thrombectomy;  Surgeon: Elmira Newman PARAS, MD;  Location: [facility];  Service: Cardiovascular;  Laterality: N/A;   CORONARY ULTRASOUND/IVUS N/A 02/10/2019   Procedure: Intravascular Ultrasound/IVUS;  Surgeon: Elmira Newman PARAS, MD;  Location: [facility];  Service: Cardiovascular;  Laterality: N/A;   CORONARY/GRAFT ACUTE MI REVASCULARIZATION N/A 02/10/2019   Procedure: Coronary/Graft Acute MI Revascularization;  Surgeon: Elmira Newman PARAS, MD;  Location: [facility];  Service: Cardiovascular;  Laterality: N/A;    INGUINAL HERNIA REPAIR Bilateral 07/10/2020   Procedure: LAPAROSCOPIC BILATERAL INGUINAL HERNIA REPAIR;  Surgeon: [name], MD;  Location: [facility] OR;  Service: General;  Laterality: Bilateral;   INSERTION OF MESH Bilateral 07/10/2020   Procedure: INSERTION OF MESH;  Surgeon: [name], MD;  Location: [facility] OR;  Service: General;  Laterality: Bilateral;   LEFT HEART CATH AND CORONARY ANGIOGRAPHY N/A 02/10/2019   Procedure: LEFT HEART CATH AND CORONARY ANGIOGRAPHY;  Surgeon: Elmira Newman PARAS, MD;  Location: [facility];  Service: Cardiovascular;  Laterality: N/A;   TONSILLECTOMY  1966   FAMILY HISTORY Family History  Problem Relation Age of Onset   Stroke Mother    Hypertension Mother    Hyperlipidemia Mother    Heart attack Father    Heart disease Father    Hypertension Father    Hyperlipidemia Father    Breast cancer Sister    Liver cancer Sister    Bone cancer Sister    Hypertension Brother    SOCIAL HISTORY Social History   Tobacco Use   Smoking status: Former    Current packs/day: 0.00    Average packs/day: 1 pack/day for 43.0 years (43.0 ttl pk-yrs)    Types: Cigarettes    Start date: 02/10/1976    Quit date: 02/10/2019    Years since quitting: 4.8   Smokeless tobacco: Never  Vaping Use   Vaping status: Never Used  Substance Use Topics   Alcohol use: Yes    Comment: social   Drug use: No       OPHTHALMIC EXAM:  Base Eye Exam     Visual Acuity (Snellen - Linear)       Right Left   Dist cc 20/20 20/50   Dist ph cc  20/40    Correction: Glasses         Tonometry (Tonopen, 9:11 AM)       Right Left   Pressure 15 13         Pupils       Dark Light Shape React APD   Right 3 2 Round Brisk None   Left 3 2 Round Brisk None         Visual Fields (Counting fingers)       Left Right    Full Full         Extraocular Movement       Right Left    Full, Ortho Full, Ortho         Neuro/Psych     Oriented x3: Yes    Mood/Affect: Normal         Dilation     Both eyes: 1.0% Mydriacyl, 2.5% Phenylephrine @ 9:11 AM           Slit Lamp and Fundus Exam     Slit Lamp Exam       Right  Left   Lids/Lashes Dermatochalasis - upper lid Dermatochalasis - upper lid   Conjunctiva/Sclera White and quiet White and quiet   Cornea trace PEE tear film debris   Anterior Chamber deep and clear deep and clear   Iris Round and dilated Round and dilated   Lens 2+ Nuclear sclerosis, 2+ Cortical cataract 2+ Nuclear sclerosis, 2+ Cortical cataract   Anterior Vitreous mild syneresis mild syneresis         Fundus Exam       Right Left   Disc Pink and Sharp, Compact Pink and Sharp, Compact   C/D Ratio 0.2 0.2   Macula Flat, Good foveal reflex, Drusen, RPE mottling and clumping, pigmented choroidal lesion just inside ST arcades (1.5Vx1.0H) -- flat, no SRF or orange pigment Flat, Blunted foveal reflex, central CNV with +SRF / edema, no heme   Vessels mild attenuation, mild tortuosity attenuated, Tortuous   Periphery Attached, No heme Attached, No heme           Refraction     Wearing Rx       Sphere Cylinder Axis Add   Right -0.50 +0.50 015 +2.25   Left -1.75 +2.25 063 +2.25           IMAGING AND PROCEDURES  Imaging and Procedures for 12/02/2023  OCT, Retina - OU - Both Eyes       Right Eye Quality was good. Central Foveal Thickness: 338. Progression has been stable. Findings include normal foveal contour, no IRF, no SRF, retinal drusen , vitreomacular adhesion (Focal hyper reflective choroidal lesion ST mac seen best on widefield--stable).   Left Eye Quality was good. Central Foveal Thickness: 373. Progression has been stable. Findings include no IRF, abnormal foveal contour, retinal drusen , choroidal neovascular membrane, pigment epithelial detachment, subretinal fluid (central CNV / PED with persistent SRF overlying--minimal improvement).   Notes *Images captured and stored on  drive  Diagnosis / Impression:  OD: nonexudative ARMD; Focal hyper reflective choroidal lesion ST mac seen best on widefield--stable OS: central CNV / PED with persistent SRF overlying--minimal improvement   Clinical management:  See below  Abbreviations: NFP - Normal foveal profile. CME - cystoid macular edema. PED - pigment epithelial detachment. IRF - intraretinal fluid. SRF - subretinal fluid. EZ - ellipsoid zone. ERM - epiretinal membrane. ORA - outer retinal atrophy. ORT - outer retinal tubulation. SRHM - subretinal hyper-reflective material. IRHM - intraretinal hyper-reflective material      Intravitreal Injection, Pharmacologic Agent - OS - Left Eye       Time Out 12/02/2023. 10:28 AM. Confirmed correct patient, procedure, site, and patient consented.   Anesthesia Topical anesthesia was used. Anesthetic medications included Lidocaine  2%, Proparacaine 0.5%.   Procedure Preparation included 5% betadine to ocular surface, eyelid speculum. A (32g) needle was used.   Injection: 2 mg aflibercept  2 MG/0.05ML   Route: Intravitreal, Site: Left Eye   NDC: [id number], Lot: [id number], Expiration date: 12/11/2024, Waste: 0 mL   Post-op Post injection exam found visual acuity of at least counting fingers. The patient tolerated the procedure well. There were no complications. The patient received written and verbal post procedure care education.           ASSESSMENT/PLAN:   ICD-10-CM   1. Exudative age-related macular degeneration of left eye with active choroidal neovascularization (HCC)  H35.3221 OCT, Retina - OU - Both Eyes    Intravitreal Injection, Pharmacologic Agent - OS - Left Eye    aflibercept  (EYLEA ) SOLN 2 mg  2. Early dry stage nonexudative age-related macular degeneration of right eye  H35.3111     3. Essential hypertension  I10     4. Hypertensive retinopathy of both eyes  H35.033     5. Nevus of choroid of right eye  D31.31     6. Combined forms of  age-related cataract of both eyes  H25.813      1. Exudative age related macular degeneration, OS - at initial visit, pt reported several week history of decreased vision OS - s/p IVA OS #1 (05.30.25), #2 (07.02.25), #3 (07.30.25) - IVA resistance ========== - s/p IVE OS #1 (08.27.25), #2 (09.25.25), #3 (10.23.25)  - BCVA OS 20/40 - stable - OCT shows central CNV / PED with persistent SRF overlying--minimal improvement at 4 weeks **discussed decreased efficacy / resistance to Eylea  and potential benefit of switching to Vabysmo**  - recommend IVE OS #4 today, 11.20.25 with follow up in 4 weeks  - pt wishes to be treated with IVE  - RBA of procedure discussed, questions answered - IVE OS informed consent obtained and signed OS 08.27.25 - see procedure note - Eylea  auth approved, [employer] covered 100% after copay-verify at each visit  - f/u in 4 wks, DFE, OCT, possible injxn  2. Age related macular degeneration, non-exudative, right eye - The incidence, anatomy, and pathology of dry AMD, risk of progression, and the AREDS and AREDS 2 study including smoking risks discussed with patient.  - Recommend amsler grid monitoring  - monitor  3,4. Hypertensive retinopathy OU - discussed importance of tight BP control - monitor  5. Choroidal Nevus, OD  - 1.5 x 1DD, flat, pigmented lesion just inside ST arcades  - no visual symptoms, SRF or orange pigment  - no drusen  - thickness < 2mm  - discussed findings, prognosis  - monitor  6. Mixed Cataract OU - The symptoms of cataract, surgical options, and treatments and risks were discussed with patient. - discussed diagnosis and progression - monitor  Ophthalmic Meds Ordered this visit:  Meds ordered this encounter  Medications   aflibercept  (EYLEA ) SOLN 2 mg     Return in about 4 weeks (around 12/30/2023) for exu ARMD OS, DFE, OCT, Possible Injxn.  There are no Patient Instructions on file for this visit.  Explained the diagnoses, plan,  and follow up with the patient and they expressed understanding.  Patient expressed understanding of the importance of proper follow up care.   This document serves as a record of services personally performed by Redell JUDITHANN Hans, MD, PhD. It was created on their behalf by Auston Muzzy, COMT. The creation of this record is the provider's dictation and/or activities during the visit.  Electronically signed by: Auston Muzzy, COMT 12/02/23 11:54 AM  This document serves as a record of services personally performed by Redell JUDITHANN Hans, MD, PhD. It was created on their behalf by Almetta Pesa, an ophthalmic technician. The creation of this record is the provider's dictation and/or activities during the visit.    Electronically signed by: Almetta Pesa, OA, 12/02/23  11:54 AM  Redell JUDITHANN Hans, M.D., Ph.D. Diseases & Surgery of the Retina and Vitreous [facility]  I have reviewed the above documentation for accuracy and completeness, and I agree with the above. Redell JUDITHANN Hans, M.D., Ph.D. 12/02/23 11:55 AM   Abbreviations: M myopia (nearsighted); A astigmatism; H hyperopia (farsighted); P presbyopia; Mrx spectacle prescription;  CTL contact lenses; OD right eye; OS left eye; OU  both eyes  XT exotropia; ET esotropia; PEK punctate epithelial keratitis; PEE punctate epithelial erosions; DES dry eye syndrome; MGD meibomian gland dysfunction; ATs artificial tears; PFAT's preservative free artificial tears; NSC nuclear sclerotic cataract; PSC posterior subcapsular cataract; ERM epi-retinal membrane; PVD posterior vitreous detachment; RD retinal detachment; DM diabetes mellitus; DR diabetic retinopathy; NPDR non-proliferative diabetic retinopathy; PDR proliferative diabetic retinopathy; CSME clinically significant macular edema; DME diabetic macular edema; dbh dot blot hemorrhages; CWS cotton wool spot; POAG primary open angle glaucoma; C/D cup-to-disc ratio; HVF humphrey visual field;  GVF goldmann visual field; OCT optical coherence tomography; IOP intraocular pressure; BRVO Branch retinal vein occlusion; CRVO central retinal vein occlusion; CRAO central retinal artery occlusion; BRAO branch retinal artery occlusion; RT retinal tear; SB scleral buckle; PPV pars plana vitrectomy; VH Vitreous hemorrhage; PRP panretinal laser photocoagulation; IVK intravitreal kenalog; VMT vitreomacular traction; MH Macular hole;  NVD neovascularization of the disc; NVE neovascularization elsewhere; AREDS age related eye disease study; ARMD age related macular degeneration; POAG primary open angle glaucoma; EBMD epithelial/anterior basement membrane dystrophy; ACIOL anterior chamber intraocular lens; IOL intraocular lens; PCIOL posterior chamber intraocular lens; Phaco/IOL phacoemulsification with intraocular lens placement; PRK photorefractive keratectomy; LASIK laser assisted in situ keratomileusis; HTN hypertension; DM diabetes mellitus; COPD chronic obstructive pulmonary disease

## 2023-12-02 ENCOUNTER — Encounter (INDEPENDENT_AMBULATORY_CARE_PROVIDER_SITE_OTHER): Payer: Self-pay | Admitting: Ophthalmology

## 2023-12-02 ENCOUNTER — Ambulatory Visit (INDEPENDENT_AMBULATORY_CARE_PROVIDER_SITE_OTHER): Admitting: Ophthalmology

## 2023-12-02 DIAGNOSIS — H353111 Nonexudative age-related macular degeneration, right eye, early dry stage: Secondary | ICD-10-CM

## 2023-12-02 DIAGNOSIS — H35033 Hypertensive retinopathy, bilateral: Secondary | ICD-10-CM

## 2023-12-02 DIAGNOSIS — I10 Essential (primary) hypertension: Secondary | ICD-10-CM

## 2023-12-02 DIAGNOSIS — H25813 Combined forms of age-related cataract, bilateral: Secondary | ICD-10-CM

## 2023-12-02 DIAGNOSIS — D3131 Benign neoplasm of right choroid: Secondary | ICD-10-CM

## 2023-12-02 DIAGNOSIS — H353221 Exudative age-related macular degeneration, left eye, with active choroidal neovascularization: Secondary | ICD-10-CM | POA: Diagnosis not present

## 2023-12-02 MED ORDER — AFLIBERCEPT 2MG/0.05ML IZ SOLN FOR KALEIDOSCOPE
2.0000 mg | INTRAVITREAL | Status: AC | PRN
  Administered 2023-12-02: 2 mg via INTRAVITREAL

## 2023-12-20 ENCOUNTER — Encounter (HOSPITAL_COMMUNITY): Payer: Self-pay | Admitting: General Surgery

## 2023-12-21 DIAGNOSIS — D0471 Carcinoma in situ of skin of right lower limb, including hip: Secondary | ICD-10-CM | POA: Diagnosis not present

## 2023-12-21 DIAGNOSIS — D485 Neoplasm of uncertain behavior of skin: Secondary | ICD-10-CM | POA: Diagnosis not present

## 2023-12-21 DIAGNOSIS — D0461 Carcinoma in situ of skin of right upper limb, including shoulder: Secondary | ICD-10-CM | POA: Diagnosis not present

## 2023-12-29 NOTE — Progress Notes (Shared)
 [facility] - Clinic Note  12/30/2023   CHIEF COMPLAINT Patient presents for Retina Follow Up  HISTORY OF PRESENT ILLNESS: [name] is a 68 y.o. male who presents to the clinic today for:  HPI     Retina Follow Up   Patient presents with  Wet AMD.  In left eye.  This started 4 weeks ago.  I, the attending physician,  performed the HPI with the patient and updated documentation appropriately.        Comments   Patient here for 4 weeks retina follow up for exu ARMD OS. Patient states vision about the same. No eye pain. Pt denies FOL/ floaters.       Last edited by [name] on 12/30/2023  8:05 AM.      Patient states very little change in vision  Referring physician: [name], MD [phone] [place],  [place] [address]  HISTORICAL INFORMATION:  Selected notes from the MEDICAL RECORD NUMBER Referred by [name] for concern of SRF OS [name]:  Ocular Hx- PMH-   CURRENT MEDICATIONS: Current Outpatient Medications (Ophthalmic Drugs)  Medication Sig   Bevacizumab  1.25 MG/0.05ML SOSY by Intravitreal route.   No current facility-administered medications for this visit. (Ophthalmic Drugs)   Current Outpatient Medications (Other)  Medication Sig   acetaminophen  (TYLENOL ) 500 MG tablet Take 500-1,000 mg by mouth every 6 (six) hours as needed (for headaches).   amLODipine  (NORVASC ) 5 MG tablet Take 1 tablet (5 mg total) by mouth daily.   aspirin  EC 81 MG tablet Take 81 mg by mouth daily.   Bempedoic Acid  180 MG TABS Take 1 tablet (180 mg total) by mouth daily.   Cholecalciferol (VITAMIN D3) 50 MCG (2000 UT) TABS Take 4,000 Units by mouth daily.   ezetimibe (ZETIA) 10 MG tablet Take 10 mg by mouth daily.   furosemide  (LASIX ) 40 MG tablet Take 1 tablet (40 mg total) by mouth daily.   Multiple Vitamins-Minerals (PRESERVISION AREDS 2) CAPS Take 1 capsule by mouth daily.   nitroGLYCERIN  (NITROSTAT ) 0.4 MG SL tablet Place 1 tablet (0.4  mg total) under the tongue every 5 (five) minutes as needed for chest pain. (Pt aware to not take Sildenafil with use of nitroglycerin .  Needs to take 36 hours apart from Sildenafil)   olmesartan (BENICAR) 40 MG tablet Take 40 mg by mouth daily.   pravastatin  (PRAVACHOL ) 40 MG tablet Take 40 mg by mouth daily.   sildenafil (REVATIO) 20 MG tablet Take 40-100 mg by mouth daily as needed (ED).   vitamin B-12 (CYANOCOBALAMIN ) 1000 MCG tablet 1 tablet   No current facility-administered medications for this visit. (Other)   REVIEW OF SYSTEMS:   ALLERGIES Allergies  Allergen Reactions   Ceclor [Cefaclor] Other (See Comments)    Reaction not recalled by the patient   Crestor  [Rosuvastatin ] Other (See Comments)    Muscle aches/pains   Lipitor [Atorvastatin ]     Muscle aches/pains   Zetia [Ezetimibe] Other (See Comments)    Muscle aches/pains   PAST MEDICAL HISTORY Past Medical History:  Diagnosis Date   Cancer (HCC)    Melanoma and Basal Cell Carcinoma   History of kidney stones    Hyperlipidemia    Hypertension    Myocardial infarction (HCC)    Nephrolithiasis    Pneumonia    Psoriasis    Tobacco abuse    Past Surgical History:  Procedure Laterality Date   APPENDECTOMY  1969  COLONOSCOPY     CORONARY THROMBECTOMY N/A 02/10/2019   Procedure: Coronary Thrombectomy;  Surgeon: Elmira Newman PARAS, MD;  Location: [facility];  Service: Cardiovascular;  Laterality: N/A;   CORONARY ULTRASOUND/IVUS N/A 02/10/2019   Procedure: Intravascular Ultrasound/IVUS;  Surgeon: Elmira Newman PARAS, MD;  Location: [facility];  Service: Cardiovascular;  Laterality: N/A;   CORONARY/GRAFT ACUTE MI REVASCULARIZATION N/A 02/10/2019   Procedure: Coronary/Graft Acute MI Revascularization;  Surgeon: Elmira Newman PARAS, MD;  Location: [facility];  Service: Cardiovascular;  Laterality: N/A;   INGUINAL HERNIA REPAIR Bilateral 07/10/2020   Procedure: LAPAROSCOPIC BILATERAL INGUINAL  HERNIA REPAIR;  Surgeon: [name], MD;  Location: [facility] OR;  Service: General;  Laterality: Bilateral;   LEFT HEART CATH AND CORONARY ANGIOGRAPHY N/A 02/10/2019   Procedure: LEFT HEART CATH AND CORONARY ANGIOGRAPHY;  Surgeon: Elmira Newman PARAS, MD;  Location: [facility];  Service: Cardiovascular;  Laterality: N/A;   TONSILLECTOMY  1966   FAMILY HISTORY Family History  Problem Relation Age of Onset   Stroke Mother    Hypertension Mother    Hyperlipidemia Mother    Heart attack Father    Heart disease Father    Hypertension Father    Hyperlipidemia Father    Breast cancer Sister    Liver cancer Sister    Bone cancer Sister    Hypertension Brother    SOCIAL HISTORY Social History   Tobacco Use   Smoking status: Former    Current packs/day: 0.00    Average packs/day: 1 pack/day for 43.0 years (43.0 ttl pk-yrs)    Types: Cigarettes    Start date: 02/10/1976    Quit date: 02/10/2019    Years since quitting: 4.8   Smokeless tobacco: Never  Vaping Use   Vaping status: Never Used  Substance Use Topics   Alcohol use: Yes    Comment: social   Drug use: No       OPHTHALMIC EXAM:  Base Eye Exam     Visual Acuity (Snellen - Linear)       Right Left   Dist cc 20/20 -1 20/40   Dist ph cc  NI    Correction: Glasses         Tonometry (Tonopen, 8:08 AM)       Right Left   Pressure 12 13         Pupils       Pupils Dark Light Shape React APD   Right PERRL 3 2 Round Brisk None   Left PERRL 3 2 Round Brisk None         Visual Fields       Left Right    Full Full         Extraocular Movement       Right Left    Full, Ortho Full, Ortho         Neuro/Psych     Oriented x3: Yes   Mood/Affect: Normal         Dilation     Both eyes: 1.0% Mydriacyl, 2.5% Phenylephrine @ 8:08 AM           Slit Lamp and Fundus Exam     Slit Lamp Exam       Right Left   Lids/Lashes Dermatochalasis - upper lid Dermatochalasis - upper lid    Conjunctiva/Sclera White and quiet White and quiet   Cornea trace PEE tear film debris   Anterior Chamber deep and clear deep and  clear   Iris Round and dilated Round and dilated   Lens 2+ Nuclear sclerosis, 2+ Cortical cataract 2+ Nuclear sclerosis, 2+ Cortical cataract   Anterior Vitreous mild syneresis mild syneresis         Fundus Exam       Right Left   Disc Pink and Sharp, Compact Pink and Sharp, Compact   C/D Ratio 0.2 0.2   Macula Flat, Good foveal reflex, Drusen, RPE mottling and clumping, pigmented choroidal lesion just inside ST arcades (1.5Vx1.0H) -- flat, no SRF or orange pigment Flat, Blunted foveal reflex, central CNV with +SRF / edema, no heme   Vessels mild attenuation, mild tortuosity attenuated, Tortuous   Periphery Attached, No heme Attached, No heme           Refraction     Wearing Rx       Sphere Cylinder Axis Add   Right -0.50 +0.50 015 +2.25   Left -1.75 +2.25 063 +2.25           IMAGING AND PROCEDURES  Imaging and Procedures for 12/30/2023  OCT, Retina - OU - Both Eyes       Right Eye Quality was good. Central Foveal Thickness: 339. Progression has been stable. Findings include normal foveal contour, no IRF, no SRF, retinal drusen , vitreomacular adhesion (Focal hyper reflective choroidal lesion ST mac seen best on widefield--stable).   Left Eye Quality was good. Central Foveal Thickness: 370. Progression has improved. Findings include no IRF, abnormal foveal contour, retinal drusen , choroidal neovascular membrane, pigment epithelial detachment, subretinal fluid (central CNV / PED with persistent SRF overlying--slightly improved).   Notes *Images captured and stored on drive  Diagnosis / Impression:  OD: nonexudative ARMD; Focal hyper reflective choroidal lesion ST mac seen best on widefield--stable OS: central CNV / PED with persistent SRF overlying--slightly improved  Clinical management:  See below  Abbreviations: NFP - Normal  foveal profile. CME - cystoid macular edema. PED - pigment epithelial detachment. IRF - intraretinal fluid. SRF - subretinal fluid. EZ - ellipsoid zone. ERM - epiretinal membrane. ORA - outer retinal atrophy. ORT - outer retinal tubulation. SRHM - subretinal hyper-reflective material. IRHM - intraretinal hyper-reflective material      Intravitreal Injection, Pharmacologic Agent - OS - Left Eye       Time Out 12/30/2023. 8:52 AM. Confirmed correct patient, procedure, site, and patient consented.   Anesthesia Topical anesthesia was used. Anesthetic medications included Lidocaine  2%, Proparacaine 0.5%.   Procedure Preparation included 5% betadine to ocular surface, eyelid speculum. A (32g) needle was used.   Injection: 2 mg aflibercept  2 MG/0.05ML   Route: Intravitreal, Site: Left Eye   NDC: [id number], Waste: 0 mL   Post-op Post injection exam found visual acuity of at least counting fingers. The patient tolerated the procedure well. There were no complications. The patient received written and verbal post procedure care education.            ASSESSMENT/PLAN:   ICD-10-CM   1. Exudative age-related macular degeneration of left eye with active choroidal neovascularization (HCC)  H35.3221 OCT, Retina - OU - Both Eyes    Intravitreal Injection, Pharmacologic Agent - OS - Left Eye    2. Early dry stage nonexudative age-related macular degeneration of right eye  H35.3111     3. Essential hypertension  I10     4. Hypertensive retinopathy of both eyes  H35.033     5. Nevus of choroid of right eye  D31.31  6. Combined forms of age-related cataract of both eyes  H25.813       1. Exudative age related macular degeneration, OS - at initial visit, pt reported several week history of decreased vision OS - s/p IVA OS #1 (05.30.25), #2 (07.02.25), #3 (07.30.25) - IVA resistance ========== - s/p IVE OS #1 (08.27.25), #2 (09.25.25), #3 (10.23.25), #4 (11.20.25)  - BCVA OS 20/40 -  stable - OCT shows central CNV / PED with persistent SRF overlying--slightly improved at 4 weeks **discussed decreased efficacy / resistance to Eylea  and potential benefit of switching to Vabysmo**  - recommend IVE OS #5 today, 12.18.25 with follow up in 4 weeks  - pt wishes to be treated with IVE  - RBA of procedure discussed, questions answered - IVE OS informed consent obtained and signed OS 08.27.25 - see procedure note - Eylea  auth approved, [employer] covered 100% after copay-verify at each visit - Vabysmo auth still pending a/o 12.18.25 visit  - f/u in 4 wks, DFE, OCT, possible injxn  2. Age related macular degeneration, non-exudative, right eye - The incidence, anatomy, and pathology of dry AMD, risk of progression, and the AREDS and AREDS 2 study including smoking risks discussed with patient.  - Recommend amsler grid monitoring  - monitor  3,4. Hypertensive retinopathy OU - discussed importance of tight BP control - monitor  5. Choroidal Nevus, OD  - 1.5 x 1DD, flat, pigmented lesion just inside ST arcades  - no visual symptoms, SRF or orange pigment  - no drusen  - thickness < 2mm  - discussed findings, prognosis  - monitor  6. Mixed Cataract OU - The symptoms of cataract, surgical options, and treatments and risks were discussed with patient. - discussed diagnosis and progression - monitor  Ophthalmic Meds Ordered this visit:  No orders of the defined types were placed in this encounter.    Return in about 29 days (around 01/28/2024) for 4wks exu ARMD OS, DFE, OCT.  There are no Patient Instructions on file for this visit.  Explained the diagnoses, plan, and follow up with the patient and they expressed understanding.  Patient expressed understanding of the importance of proper follow up care.   This document serves as a record of services personally performed by Redell JUDITHANN Hans, MD, PhD. It was created on their behalf by Auston Muzzy, COMT. The creation of this  record is the provider's dictation and/or activities during the visit.  Electronically signed by: Auston Muzzy, COMT 12/30/2023 8:58 AM  This document serves as a record of services personally performed by Redell JUDITHANN Hans, MD, PhD. It was created on their behalf by Almetta Pesa, an ophthalmic technician. The creation of this record is the provider's dictation and/or activities during the visit.    Electronically signed by: Almetta Pesa, OA, 12/30/2023  8:58 AM  Redell JUDITHANN Hans, M.D., Ph.D. Diseases & Surgery of the Retina and Vitreous [facility]    Abbreviations: M myopia (nearsighted); A astigmatism; H hyperopia (farsighted); P presbyopia; Mrx spectacle prescription;  CTL contact lenses; OD right eye; OS left eye; OU both eyes  XT exotropia; ET esotropia; PEK punctate epithelial keratitis; PEE punctate epithelial erosions; DES dry eye syndrome; MGD meibomian gland dysfunction; ATs artificial tears; PFAT's preservative free artificial tears; NSC nuclear sclerotic cataract; PSC posterior subcapsular cataract; ERM epi-retinal membrane; PVD posterior vitreous detachment; RD retinal detachment; DM diabetes mellitus; DR diabetic retinopathy; NPDR non-proliferative diabetic retinopathy; PDR proliferative diabetic retinopathy; CSME clinically significant macular edema;  DME diabetic macular edema; dbh dot blot hemorrhages; CWS cotton wool spot; POAG primary open angle glaucoma; C/D cup-to-disc ratio; HVF humphrey visual field; GVF goldmann visual field; OCT optical coherence tomography; IOP intraocular pressure; BRVO Branch retinal vein occlusion; CRVO central retinal vein occlusion; CRAO central retinal artery occlusion; BRAO branch retinal artery occlusion; RT retinal tear; SB scleral buckle; PPV pars plana vitrectomy; VH Vitreous hemorrhage; PRP panretinal laser photocoagulation; IVK intravitreal kenalog; VMT vitreomacular traction; MH Macular hole;  NVD neovascularization  of the disc; NVE neovascularization elsewhere; AREDS age related eye disease study; ARMD age related macular degeneration; POAG primary open angle glaucoma; EBMD epithelial/anterior basement membrane dystrophy; ACIOL anterior chamber intraocular lens; IOL intraocular lens; PCIOL posterior chamber intraocular lens; Phaco/IOL phacoemulsification with intraocular lens placement; PRK photorefractive keratectomy; LASIK laser assisted in situ keratomileusis; HTN hypertension; DM diabetes mellitus; COPD chronic obstructive pulmonary disease

## 2023-12-30 ENCOUNTER — Ambulatory Visit (INDEPENDENT_AMBULATORY_CARE_PROVIDER_SITE_OTHER): Admitting: Ophthalmology

## 2023-12-30 ENCOUNTER — Encounter (INDEPENDENT_AMBULATORY_CARE_PROVIDER_SITE_OTHER): Payer: Self-pay | Admitting: Ophthalmology

## 2023-12-30 DIAGNOSIS — H25813 Combined forms of age-related cataract, bilateral: Secondary | ICD-10-CM | POA: Diagnosis not present

## 2023-12-30 DIAGNOSIS — H35033 Hypertensive retinopathy, bilateral: Secondary | ICD-10-CM

## 2023-12-30 DIAGNOSIS — D3131 Benign neoplasm of right choroid: Secondary | ICD-10-CM

## 2023-12-30 DIAGNOSIS — I10 Essential (primary) hypertension: Secondary | ICD-10-CM | POA: Diagnosis not present

## 2023-12-30 DIAGNOSIS — H353111 Nonexudative age-related macular degeneration, right eye, early dry stage: Secondary | ICD-10-CM

## 2023-12-30 DIAGNOSIS — H353221 Exudative age-related macular degeneration, left eye, with active choroidal neovascularization: Secondary | ICD-10-CM

## 2023-12-30 MED ORDER — AFLIBERCEPT 2MG/0.05ML IZ SOLN FOR KALEIDOSCOPE
2.0000 mg | INTRAVITREAL | Status: AC | PRN
  Administered 2023-12-30: 15:00:00 2 mg via INTRAVITREAL

## 2024-01-31 NOTE — Progress Notes (Signed)
 " [facility] - Clinic Note  02/04/2024   CHIEF COMPLAINT Patient presents for Retina Follow Up  HISTORY OF PRESENT ILLNESS: [name] is a 69 y.o. male who presents to the clinic today for:  HPI     Retina Follow Up   Patient presents with  Wet AMD.  In both eyes.  This started 4 weeks ago.  Duration of 4 weeks.  Since onset it is stable.  I, the attending physician,  performed the HPI with the patient and updated documentation appropriately.        Comments   4 week retina follow up ARMD OS and I'VE OS pt is reporting no vision changes noticed denies any flashes or floaters       Last edited by Valdemar Rogue, MD on 02/04/2024  8:50 AM.     Patient states the vision seems to be about the same.   Referring physician: Fleeta Zerita DASEN, MD [address] [place],  [place] [address]  HISTORICAL INFORMATION:  Selected notes from the MEDICAL RECORD NUMBER Referred by [name] for concern of SRF OS [name]:  Ocular Hx- PMH-   CURRENT MEDICATIONS: Current Outpatient Medications (Ophthalmic Drugs)  Medication Sig   Bevacizumab  1.25 MG/0.05ML SOSY by Intravitreal route.   No current facility-administered medications for this visit. (Ophthalmic Drugs)   Current Outpatient Medications (Other)  Medication Sig   acetaminophen  (TYLENOL ) 500 MG tablet Take 500-1,000 mg by mouth every 6 (six) hours as needed (for headaches).   amLODipine  (NORVASC ) 5 MG tablet Take 1 tablet (5 mg total) by mouth daily.   aspirin  EC 81 MG tablet Take 81 mg by mouth daily.   Bempedoic Acid  180 MG TABS Take 1 tablet (180 mg total) by mouth daily.   Cholecalciferol (VITAMIN D3) 50 MCG (2000 UT) TABS Take 4,000 Units by mouth daily.   ezetimibe (ZETIA) 10 MG tablet Take 10 mg by mouth daily.   furosemide  (LASIX ) 40 MG tablet Take 1 tablet (40 mg total) by mouth daily.   Multiple Vitamins-Minerals (PRESERVISION AREDS 2) CAPS Take 1 capsule by mouth daily.   nitroGLYCERIN  (NITROSTAT ) 0.4  MG SL tablet Place 1 tablet (0.4 mg total) under the tongue every 5 (five) minutes as needed for chest pain. (Pt aware to not take Sildenafil with use of nitroglycerin .  Needs to take 36 hours apart from Sildenafil)   olmesartan (BENICAR) 40 MG tablet Take 40 mg by mouth daily.   pravastatin  (PRAVACHOL ) 40 MG tablet Take 40 mg by mouth daily.   sildenafil (REVATIO) 20 MG tablet Take 40-100 mg by mouth daily as needed (ED).   vitamin B-12 (CYANOCOBALAMIN ) 1000 MCG tablet 1 tablet   No current facility-administered medications for this visit. (Other)   REVIEW OF SYSTEMS: ROS   Positive for: Gastrointestinal, Cardiovascular, Eyes, Allergic/Imm Last edited by Resa Delon ORN, COT on 02/04/2024  8:26 AM.      ALLERGIES Allergies  Allergen Reactions   Ceclor [Cefaclor] Other (See Comments)    Reaction not recalled by the patient   Crestor  [Rosuvastatin ] Other (See Comments)    Muscle aches/pains   Lipitor [Atorvastatin ]     Muscle aches/pains   Zetia [Ezetimibe] Other (See Comments)    Muscle aches/pains   PAST MEDICAL HISTORY Past Medical History:  Diagnosis Date   Cancer (HCC)    Melanoma and Basal Cell Carcinoma   History of kidney stones    Hyperlipidemia    Hypertension    Myocardial infarction (  HCC)    Nephrolithiasis    Pneumonia    Psoriasis    Tobacco abuse    Past Surgical History:  Procedure Laterality Date   APPENDECTOMY  1969   COLONOSCOPY     CORONARY THROMBECTOMY N/A 02/10/2019   Procedure: Coronary Thrombectomy;  Surgeon: Elmira Newman PARAS, MD;  Location: [facility];  Service: Cardiovascular;  Laterality: N/A;   CORONARY ULTRASOUND/IVUS N/A 02/10/2019   Procedure: Intravascular Ultrasound/IVUS;  Surgeon: Elmira Newman PARAS, MD;  Location: [facility];  Service: Cardiovascular;  Laterality: N/A;   CORONARY/GRAFT ACUTE MI REVASCULARIZATION N/A 02/10/2019   Procedure: Coronary/Graft Acute MI Revascularization;  Surgeon: Elmira Newman PARAS, MD;  Location: [facility];  Service: Cardiovascular;  Laterality: N/A;   INGUINAL HERNIA REPAIR Bilateral 07/10/2020   Procedure: LAPAROSCOPIC BILATERAL INGUINAL HERNIA REPAIR;  Surgeon: [name], MD;  Location: [facility] OR;  Service: General;  Laterality: Bilateral;   LEFT HEART CATH AND CORONARY ANGIOGRAPHY N/A 02/10/2019   Procedure: LEFT HEART CATH AND CORONARY ANGIOGRAPHY;  Surgeon: Elmira Newman PARAS, MD;  Location: [facility];  Service: Cardiovascular;  Laterality: N/A;   TONSILLECTOMY  1966   FAMILY HISTORY Family History  Problem Relation Age of Onset   Stroke Mother    Hypertension Mother    Hyperlipidemia Mother    Heart attack Father    Heart disease Father    Hypertension Father    Hyperlipidemia Father    Breast cancer Sister    Liver cancer Sister    Bone cancer Sister    Hypertension Brother    SOCIAL HISTORY Social History   Tobacco Use   Smoking status: Former    Current packs/day: 0.00    Average packs/day: 1 pack/day for 43.0 years (43.0 ttl pk-yrs)    Types: Cigarettes    Start date: 02/10/1976    Quit date: 02/10/2019    Years since quitting: 4.9   Smokeless tobacco: Never  Vaping Use   Vaping status: Never Used  Substance Use Topics   Alcohol use: Yes    Comment: social   Drug use: No       OPHTHALMIC EXAM:  Base Eye Exam     Visual Acuity (Snellen - Linear)       Right Left   Dist cc 20/25 -3 20/40 -2   Dist ph cc NI NI         Tonometry (Tonopen, 8:30 AM)       Right Left   Pressure 14 15         Pupils       Pupils Dark Light Shape React APD   Right PERRL 3 2 Round Brisk None   Left PERRL 3 2 Round Brisk None         Visual Fields       Left Right    Full Full         Extraocular Movement       Right Left    Full, Ortho Full, Ortho         Neuro/Psych     Oriented x3: Yes   Mood/Affect: Normal         Dilation     Both eyes: 2.5% Phenylephrine @ 8:30 AM            Slit Lamp and Fundus Exam     Slit Lamp Exam       Right Left   Lids/Lashes Dermatochalasis - upper lid Dermatochalasis -  upper lid   Conjunctiva/Sclera White and quiet White and quiet   Cornea trace PEE tear film debris   Anterior Chamber deep and clear deep and clear   Iris Round and dilated Round and dilated   Lens 2+ Nuclear sclerosis, 2+ Cortical cataract 2+ Nuclear sclerosis, 2+ Cortical cataract   Anterior Vitreous mild syneresis mild syneresis         Fundus Exam       Right Left   Disc Pink and Sharp, Compact Pink and Sharp, Compact   C/D Ratio 0.2 0.2   Macula Flat, Good foveal reflex, Drusen, RPE mottling and clumping, pigmented choroidal lesion just inside ST arcades (1.5Vx1.0H) -- flat, no SRF or orange pigment Flat, Blunted foveal reflex, +CNV w/ shallow SRF--slightly increased No heme   Vessels mild attenuation, mild tortuosity attenuated, Tortuous   Periphery Attached, No heme Attached, No heme           Refraction     Wearing Rx       Sphere Cylinder Axis Add   Right -0.50 +0.50 015 +2.25   Left -1.75 +2.25 063 +2.25           IMAGING AND PROCEDURES  Imaging and Procedures for 02/04/2024  OCT, Retina - OU - Both Eyes       Right Eye Quality was good. Central Foveal Thickness: 335. Progression has been stable. Findings include normal foveal contour, no IRF, no SRF, retinal drusen , vitreomacular adhesion (Focal hyper reflective choroidal lesion ST mac seen best on widefield--stable).   Left Eye Quality was good. Central Foveal Thickness: 397. Progression has worsened. Findings include no IRF, abnormal foveal contour, retinal drusen , choroidal neovascular membrane, pigment epithelial detachment, subretinal fluid (central CNV / PED with persistent SRF overlying--slightly increased).   Notes *Images captured and stored on drive  Diagnosis / Impression:  OD: nonexudative ARMD; Focal hyper reflective choroidal lesion ST mac seen best on  widefield--stable OS: central CNV / PED with persistent SRF overlying--slightly increased  Clinical management:  See below  Abbreviations: NFP - Normal foveal profile. CME - cystoid macular edema. PED - pigment epithelial detachment. IRF - intraretinal fluid. SRF - subretinal fluid. EZ - ellipsoid zone. ERM - epiretinal membrane. ORA - outer retinal atrophy. ORT - outer retinal tubulation. SRHM - subretinal hyper-reflective material. IRHM - intraretinal hyper-reflective material      Intravitreal Injection, Pharmacologic Agent - OS - Left Eye       Time Out 02/04/2024. 8:35 AM. Confirmed correct patient, procedure, site, and patient consented.   Anesthesia Topical anesthesia was used. Anesthetic medications included Lidocaine  2%, Proparacaine 0.5%.   Procedure Preparation included 5% betadine to ocular surface, eyelid speculum. A (32g) needle was used.   Injection: 2 mg aflibercept  2 MG/0.05ML   Route: Intravitreal, Site: Left Eye   NDC: [id number], Lot: [id number], Expiration date: 02/11/2025, Waste: 0 mL   Post-op Post injection exam found visual acuity of at least counting fingers. The patient tolerated the procedure well. There were no complications. The patient received written and verbal post procedure care education.            ASSESSMENT/PLAN:   ICD-10-CM   1. Exudative age-related macular degeneration of left eye with active choroidal neovascularization (HCC)  H35.3221 OCT, Retina - OU - Both Eyes    Intravitreal Injection, Pharmacologic Agent - OS - Left Eye    aflibercept  (EYLEA ) SOLN 2 mg    2. Early dry stage nonexudative age-related macular degeneration of right  eye  H35.3111     3. Essential hypertension  I10     4. Hypertensive retinopathy of both eyes  H35.033     5. Nevus of choroid of right eye  D31.31     6. Combined forms of age-related cataract of both eyes  H25.813      1. Exudative age related macular degeneration, OS - at initial visit,  pt reported several week history of decreased vision OS - BCVA OS 20/40 - stable - s/p IVA OS #1 (05.30.25), #2 (07.02.25), #3 (07.30.25)  - IVA resistance ========== - s/p IVE OS #1 (08.27.25), #2 (09.25.25), #3 (10.23.25), #4 (11.20.25) #5 (12.18.25) - OCT shows central CNV / PED with persistent SRF overlying--slightly increased at 5 weeks **discussed decreased efficacy / resistance to Eylea  and potential benefit of switching to Vabysmo**  - Vabysmo auth still pending as of 01.23.26 visit - recommend IVE OS #6 today, 01.23.26 with follow up in 4 weeks  - pt wishes to be treated with IVE  - RBA of procedure discussed, questions answered - IVE OS informed consent obtained and signed OS 08.27.25 - see procedure note - Eylea  auth approved, [employer] covered 100% after copay-verify at each visit - Vabysmo auth still pending as of 01.23.26 visit  - f/u in 4 wks, DFE, OCT, possible injxn  2. Age related macular degeneration, non-exudative, right eye - The incidence, anatomy, and pathology of dry AMD, risk of progression, and the AREDS and AREDS 2 study including smoking risks discussed with patient.  - Recommend amsler grid monitoring  - monitor  3,4. Hypertensive retinopathy OU - discussed importance of tight BP control - monitor  5. Choroidal Nevus, OD  - 1.5 x 1DD, flat, pigmented lesion just inside ST arcades  - no visual symptoms, SRF or orange pigment  - no drusen  - thickness < 2mm  - discussed findings, prognosis  - monitor  6. Mixed Cataract OU - The symptoms of cataract, surgical options, and treatments and risks were discussed with patient. - discussed diagnosis and progression - monitor  Ophthalmic Meds Ordered this visit:  Meds ordered this encounter  Medications   aflibercept  (EYLEA ) SOLN 2 mg     Return in about 4 weeks (around 03/03/2024) for f/u, Ex. AMD, DFE, OCT, Possible, IVE, Vs., IVV, OS.  There are no Patient Instructions on file for this visit.  This  document serves as a record of services personally performed by Redell JUDITHANN Hans, MD, PhD. It was created on their behalf by Delon Newness COT, an ophthalmic technician. The creation of this record is the provider's dictation and/or activities during the visit.    Electronically signed by: Delon Newness COT 01.19.26  8:51 AM  This document serves as a record of services personally performed by Redell JUDITHANN Hans, MD, PhD. It was created on their behalf by Wanda GEANNIE Keens, COT an ophthalmic technician. The creation of this record is the provider's dictation and/or activities during the visit.    Electronically signed by:  Wanda GEANNIE Keens, COT  02/04/24 8:51 AM  Redell JUDITHANN Hans, M.D., Ph.D. Diseases & Surgery of the Retina and Vitreous [facility] 02/04/2024   I have reviewed the above documentation for accuracy and completeness, and I agree with the above. Redell JUDITHANN Hans, M.D., Ph.D. 02/04/24 8:52 AM   Abbreviations: M myopia (nearsighted); A astigmatism; H hyperopia (farsighted); P presbyopia; Mrx spectacle prescription;  CTL contact lenses; OD right eye; OS left eye; OU both  eyes  XT exotropia; ET esotropia; PEK punctate epithelial keratitis; PEE punctate epithelial erosions; DES dry eye syndrome; MGD meibomian gland dysfunction; ATs artificial tears; PFAT's preservative free artificial tears; NSC nuclear sclerotic cataract; PSC posterior subcapsular cataract; ERM epi-retinal membrane; PVD posterior vitreous detachment; RD retinal detachment; DM diabetes mellitus; DR diabetic retinopathy; NPDR non-proliferative diabetic retinopathy; PDR proliferative diabetic retinopathy; CSME clinically significant macular edema; DME diabetic macular edema; dbh dot blot hemorrhages; CWS cotton wool spot; POAG primary open angle glaucoma; C/D cup-to-disc ratio; HVF humphrey visual field; GVF goldmann visual field; OCT optical coherence tomography; IOP intraocular pressure;  BRVO Branch retinal vein occlusion; CRVO central retinal vein occlusion; CRAO central retinal artery occlusion; BRAO branch retinal artery occlusion; RT retinal tear; SB scleral buckle; PPV pars plana vitrectomy; VH Vitreous hemorrhage; PRP panretinal laser photocoagulation; IVK intravitreal kenalog; VMT vitreomacular traction; MH Macular hole;  NVD neovascularization of the disc; NVE neovascularization elsewhere; AREDS age related eye disease study; ARMD age related macular degeneration; POAG primary open angle glaucoma; EBMD epithelial/anterior basement membrane dystrophy; ACIOL anterior chamber intraocular lens; IOL intraocular lens; PCIOL posterior chamber intraocular lens; Phaco/IOL phacoemulsification with intraocular lens placement; PRK photorefractive keratectomy; LASIK laser assisted in situ keratomileusis; HTN hypertension; DM diabetes mellitus; COPD chronic obstructive pulmonary disease  "

## 2024-02-04 ENCOUNTER — Encounter (INDEPENDENT_AMBULATORY_CARE_PROVIDER_SITE_OTHER): Payer: Self-pay | Admitting: Ophthalmology

## 2024-02-04 ENCOUNTER — Ambulatory Visit (INDEPENDENT_AMBULATORY_CARE_PROVIDER_SITE_OTHER): Admitting: Ophthalmology

## 2024-02-04 DIAGNOSIS — D3131 Benign neoplasm of right choroid: Secondary | ICD-10-CM | POA: Diagnosis not present

## 2024-02-04 DIAGNOSIS — I10 Essential (primary) hypertension: Secondary | ICD-10-CM

## 2024-02-04 DIAGNOSIS — H353221 Exudative age-related macular degeneration, left eye, with active choroidal neovascularization: Secondary | ICD-10-CM | POA: Diagnosis not present

## 2024-02-04 DIAGNOSIS — H25813 Combined forms of age-related cataract, bilateral: Secondary | ICD-10-CM | POA: Diagnosis not present

## 2024-02-04 DIAGNOSIS — H353111 Nonexudative age-related macular degeneration, right eye, early dry stage: Secondary | ICD-10-CM

## 2024-02-04 DIAGNOSIS — H35033 Hypertensive retinopathy, bilateral: Secondary | ICD-10-CM | POA: Diagnosis not present

## 2024-02-04 MED ORDER — AFLIBERCEPT 2MG/0.05ML IZ SOLN FOR KALEIDOSCOPE
2.0000 mg | INTRAVITREAL | Status: AC | PRN
  Administered 2024-02-04: 2 mg via INTRAVITREAL

## 2024-03-08 ENCOUNTER — Encounter (INDEPENDENT_AMBULATORY_CARE_PROVIDER_SITE_OTHER): Admitting: Ophthalmology

## 2024-05-03 ENCOUNTER — Other Ambulatory Visit
